# Patient Record
Sex: Female | Born: 1940 | Race: White | Hispanic: No | Smoking: Former smoker
Health system: Southern US, Community
[De-identification: ages and names within clinical notes are randomized; demographics above are authoritative.]

## PROBLEM LIST (undated history)

## (undated) DIAGNOSIS — M549 Dorsalgia, unspecified: Secondary | ICD-10-CM

## (undated) DIAGNOSIS — C3490 Malignant neoplasm of unspecified part of unspecified bronchus or lung: Secondary | ICD-10-CM

## (undated) DIAGNOSIS — M1990 Unspecified osteoarthritis, unspecified site: Secondary | ICD-10-CM

## (undated) DIAGNOSIS — M069 Rheumatoid arthritis, unspecified: Secondary | ICD-10-CM

## (undated) HISTORY — DX: Rheumatoid arthritis, unspecified: M06.9

## (undated) HISTORY — PX: LUNG REMOVAL, PARTIAL: SHX233

## (undated) HISTORY — PX: SPINAL FUSION: SHX223

## (undated) HISTORY — DX: Unspecified osteoarthritis, unspecified site: M19.90

## (undated) HISTORY — PX: REPLACEMENT TOTAL KNEE BILATERAL: SUR1225

## (undated) HISTORY — DX: Dorsalgia, unspecified: M54.9

## (undated) HISTORY — DX: Malignant neoplasm of unspecified part of unspecified bronchus or lung: C34.90

## (undated) HISTORY — PX: BREAST BIOPSY: SHX20

---

## 2019-02-14 ENCOUNTER — Other Ambulatory Visit: Payer: Self-pay | Admitting: Internal Medicine

## 2019-02-14 DIAGNOSIS — Z1231 Encounter for screening mammogram for malignant neoplasm of breast: Secondary | ICD-10-CM

## 2019-05-06 ENCOUNTER — Ambulatory Visit
Admission: RE | Admit: 2019-05-06 | Discharge: 2019-05-06 | Disposition: A | Discharge status: Home / Self Care | Payer: Medicare Other | Source: Ambulatory Visit | Attending: Internal Medicine | Admitting: Internal Medicine

## 2019-05-06 DIAGNOSIS — Z1231 Encounter for screening mammogram for malignant neoplasm of breast: Secondary | ICD-10-CM | POA: Insufficient documentation

## 2019-05-25 ENCOUNTER — Other Ambulatory Visit: Payer: Self-pay | Admitting: Internal Medicine

## 2019-05-25 DIAGNOSIS — R928 Other abnormal and inconclusive findings on diagnostic imaging of breast: Secondary | ICD-10-CM

## 2019-05-25 DIAGNOSIS — N6489 Other specified disorders of breast: Secondary | ICD-10-CM

## 2019-05-27 ENCOUNTER — Ambulatory Visit
Admission: RE | Admit: 2019-05-27 | Discharge: 2019-05-27 | Disposition: A | Discharge status: Home / Self Care | Payer: Medicare Other | Source: Ambulatory Visit | Attending: Internal Medicine | Admitting: Internal Medicine

## 2019-05-27 DIAGNOSIS — R928 Other abnormal and inconclusive findings on diagnostic imaging of breast: Secondary | ICD-10-CM | POA: Diagnosis present

## 2019-05-27 DIAGNOSIS — N6489 Other specified disorders of breast: Secondary | ICD-10-CM

## 2020-05-15 ENCOUNTER — Other Ambulatory Visit: Payer: Self-pay | Admitting: Obstetrics and Gynecology

## 2020-05-15 ENCOUNTER — Other Ambulatory Visit: Payer: Self-pay | Admitting: Internal Medicine

## 2020-05-15 DIAGNOSIS — Z1231 Encounter for screening mammogram for malignant neoplasm of breast: Secondary | ICD-10-CM

## 2020-05-31 ENCOUNTER — Other Ambulatory Visit: Payer: Self-pay

## 2020-05-31 ENCOUNTER — Ambulatory Visit
Admission: RE | Admit: 2020-05-31 | Discharge: 2020-05-31 | Disposition: A | Discharge status: Home / Self Care | Payer: Medicare Other | Source: Ambulatory Visit | Attending: Obstetrics and Gynecology | Admitting: Obstetrics and Gynecology

## 2020-05-31 DIAGNOSIS — Z1231 Encounter for screening mammogram for malignant neoplasm of breast: Secondary | ICD-10-CM | POA: Insufficient documentation

## 2020-09-11 ENCOUNTER — Other Ambulatory Visit (HOSPITAL_COMMUNITY): Payer: Self-pay | Admitting: Physical Medicine & Rehabilitation

## 2020-09-11 ENCOUNTER — Other Ambulatory Visit: Payer: Self-pay | Admitting: Physical Medicine & Rehabilitation

## 2020-09-11 DIAGNOSIS — G8929 Other chronic pain: Secondary | ICD-10-CM

## 2020-09-11 DIAGNOSIS — M5441 Lumbago with sciatica, right side: Secondary | ICD-10-CM

## 2020-09-20 ENCOUNTER — Ambulatory Visit: Payer: Medicare Other

## 2020-10-02 ENCOUNTER — Ambulatory Visit: Payer: Medicare Other | Admitting: Occupational Therapy

## 2020-10-04 ENCOUNTER — Ambulatory Visit
Admission: RE | Admit: 2020-10-04 | Discharge: 2020-10-04 | Disposition: A | Discharge status: Home / Self Care | Payer: Medicare Other | Source: Ambulatory Visit | Attending: Physical Medicine & Rehabilitation | Admitting: Physical Medicine & Rehabilitation

## 2020-10-04 ENCOUNTER — Other Ambulatory Visit: Payer: Self-pay

## 2020-10-04 ENCOUNTER — Ambulatory Visit: Payer: Medicare Other | Attending: Physical Medicine & Rehabilitation | Admitting: Occupational Therapy

## 2020-10-04 DIAGNOSIS — G8929 Other chronic pain: Secondary | ICD-10-CM

## 2020-10-04 DIAGNOSIS — M79641 Pain in right hand: Secondary | ICD-10-CM | POA: Diagnosis present

## 2020-10-04 DIAGNOSIS — M25641 Stiffness of right hand, not elsewhere classified: Secondary | ICD-10-CM | POA: Insufficient documentation

## 2020-10-04 DIAGNOSIS — M5441 Lumbago with sciatica, right side: Secondary | ICD-10-CM | POA: Insufficient documentation

## 2020-10-04 DIAGNOSIS — R600 Localized edema: Secondary | ICD-10-CM | POA: Diagnosis present

## 2020-10-04 NOTE — Therapy (Signed)
[place] [facility] [address] [place], [address] Phone: [phone]   Fax:  [phone]  Occupational Therapy Evaluation  Patient Details  Name: Crystal Malone MRN: [id number] Date of Birth: 1941/04/07 Referring Provider (OT): DR Adriana Mccallum Date: 10/04/2020   OT End of Session - 10/04/20 6962     Visit Number 1    Number of Visits 4    Date for OT Re-Evaluation 11/08/20    OT Start Time 0730    OT Stop Time 0815    OT Time Calculation (min) 45 min    Activity Tolerance Patient tolerated treatment well    Behavior During Therapy [facility] for tasks assessed/performed             No past medical history on file.  Past Surgical History:  Procedure Laterality Date   BREAST BIOPSY Left    benign     There were no vitals filed for this visit.   Subjective Assessment - 10/04/20 1634     Subjective  My hands had been bothering me for few years but gradually getting worse -the R hand worse and thumbs - index and ring finger middle knuckle - pain can increase to 8/10 - and hard time doing things like buttons, open jars or bags, had to modify a lot    Pertinent History 09/11/20 seen by DR Alba Destine -Assessment:  1. 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 Chronic right hip pain  3. Chronic right hand pain with swelling of the proximal interphalangeal joints  4. History of lung cancer 2012  5. 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 I did review the referring physician's note. She has been doing physical therapy since March 2022 and is continue with a home exercise program. She is also been taking Tylenol and tramadol. She cannot take NSAIDs due to GI bleeds. At this point she has failed conservative treatment as her pain is not under control. I did offer a right hip injection and she is interested in moving forward with this. Referral placed for right  hip injection. I also recommended MRI of the lumbar spine for further evaluation and she is interested moving forward with this as well. MRI lumbar spine ordered.    2. Follow-up 2 weeks postinjection. We will also review MRI lumbar spine at this time.    3. In terms of the right hand pain/swelling of the proximal interphalangeal joints, I did offer referral to orthopedic surgery but she would like to hold off on that. I did offer occupational therapy with inclusion of a paraffin bath and she is interested in moving forward with this. Referral placed.    Patient Stated Goals If I can get the pain better in my R hand and fingers so I can use them better- I do like to do things and activities    Currently in Pain? Yes    Pain Score 5    can increase to 8/10   Pain Location Hand    Pain Orientation Right    Pain Descriptors / Indicators Aching;Tender;Tightness    Pain Type Chronic pain    Pain Onset More than a month ago               [facility] OT Assessment - 10/04/20 0001       Assessment   Medical Diagnosis R hand  pain    Referring Provider (OT) DR Alba Destine    Onset Date/Surgical Date 09/11/20    Hand Dominance Right    Prior Therapy Years ago      Home  Environment   Lives With Alone      Prior Function   Vocation Retired    Leisure Retired Pharmacist, hospital, read, [employer], water workout, chair workout,      Strength   Right Hand Grip (lbs) 35    Right Hand Lateral Pinch 9 lbs    Right Hand 3 Point Pinch 7 lbs    Left Hand Grip (lbs) 36    Left Hand Lateral Pinch 10 lbs    Left Hand 3 Point Pinch 4 lbs      Right Hand AROM   R Thumb Opposition to Index --   Opposition WFL - some thumb pain   R Index  MCP 0-90 90 Degrees    R Index PIP 0-100 95 Degrees    R Long  MCP 0-90 90 Degrees    R Long PIP 0-100 95 Degrees    R Ring  MCP 0-90 90 Degrees    R Ring PIP 0-100 95 Degrees   -30   R Little  MCP 0-90 90 Degrees    R Little PIP 0-100 95 Degrees                      OT  Treatments/Exercises (OP) - 10/04/20 0001       RUE Paraffin   Number Minutes Paraffin 8 Minutes    RUE Paraffin Location Hand    Comments prior to review of HEP      LUE Paraffin   Number Minutes Paraffin 8 Minutes    LUE Paraffin Location Hand    Comments prior to review of HEP            pt to do moist heat am and pm Pain free tendon glides Opposition to all digits - pain free 10 reps  2 x day  Joint protection and AE trng done - and ed on - hand out provided and review         OT Education - 10/04/20 1643     Education Details findings of eval and HEP    Person(s) Educated Patient    Methods Explanation;Demonstration;Handout    Comprehension Verbal cues required;Returned demonstration;Verbalized understanding                 OT Long Term Goals - 10/04/20 1648       OT LONG TERM GOAL #1   Title Pt to be independent in HEP to decrease pain in R hand , increase 4th digit PIP extention and maintain AROM flexion and grip/prehension    Baseline no knowledge - pain 2-8/10 with flexion or use of digits- 4th PIP extention -30    Time 5    Period Weeks    Status New    Target Date 11/08/20      OT LONG TERM GOAL #2   Title Pt to be independent in HEP and verbalize 3 joint protection and AE to decrease pain in R hand    Baseline no knowledge - pain 2-8/10 with flexion or use    Time 3    Period Weeks    Status New    Target Date 10/25/20                   Plan - 10/04/20 1644  Clinical Impression Statement Pt refer to OT for R hand pain more than L -and pain mostly in thumb CMC, 2nd adn 4th PIP - enlarge PIP's and flexor contracture at 4th PIP - joints tender -and pt show AROM flexion WFL -and grip and prehension strength with range for her age - except L 3point pinch - pt pain can be 2-8/10- depending what she done - pt can benefit from OT services to decrease pain, increase or maintain ROM , strength- ed on joint protection and modification  of ADL's and IADL's to decrease pain    OT Occupational Profile and History Problem Focused Assessment - Including review of records relating to presenting problem    Occupational performance deficits (Please refer to evaluation for details): ADL's;IADL's;Play;Leisure;Social Participation    Body Structure / Function / Physical Skills ADL;Flexibility;FMC;Decreased knowledge of use of DME;Dexterity;Pain;Edema;IADL;ROM;UE functional use    Rehab Potential Fair    Clinical Decision Making Limited treatment options, no task modification necessary    Comorbidities Affecting Occupational Performance: May have comorbidities impacting occupational performance   chronic condition -arthritis   Modification or Assistance to Complete Evaluation  No modification of tasks or assist necessary to complete eval    OT Frequency 1x / week    OT Duration --   5 wks   OT Treatment/Interventions Self-care/ADL training;DME and/or AE instruction;Splinting;Paraffin    Consulted and Agree with Plan of Care Patient             Patient will benefit from skilled therapeutic intervention in order to improve the following deficits and impairments:   Body Structure / Function / Physical Skills: ADL, Flexibility, FMC, Decreased knowledge of use of DME, Dexterity, Pain, Edema, IADL, ROM, UE functional use       Visit Diagnosis: Pain in right hand - Plan: Ot plan of care cert/re-cert    Problem List There are no problems to display for this patient.   Crystal Malone  OTR/L, CLT  10/04/2020, 4:53 PM  [place] [facility] [address] [place], [address] Phone: [phone]   Fax:  [phone]  Name: Crystal Malone MRN: [id number] Date of Birth: 12/18/1940

## 2020-10-11 ENCOUNTER — Ambulatory Visit: Payer: Medicare Other | Admitting: Occupational Therapy

## 2020-10-11 ENCOUNTER — Other Ambulatory Visit: Payer: Self-pay

## 2020-10-11 DIAGNOSIS — M79641 Pain in right hand: Secondary | ICD-10-CM

## 2020-10-11 DIAGNOSIS — R600 Localized edema: Secondary | ICD-10-CM

## 2020-10-11 DIAGNOSIS — M25641 Stiffness of right hand, not elsewhere classified: Secondary | ICD-10-CM

## 2020-10-11 NOTE — Therapy (Signed)
[place] [facility] [address] [place], [address] Phone: [phone]   Fax:  [phone]  Occupational Therapy Treatment  Patient Details  Name: Crystal Malone MRN: [id number] Date of Birth: 07/06/1940 Referring Provider (OT): DR Adriana Mccallum Date: 10/11/2020   OT End of Session - 10/11/20 1432     Visit Number 2    Number of Visits 4    Date for OT Re-Evaluation 11/08/20    OT Start Time 0817    OT Stop Time 0900    OT Time Calculation (min) 43 min    Activity Tolerance Patient tolerated treatment well    Behavior During Therapy [facility] for tasks assessed/performed             No past medical history on file.  Past Surgical History:  Procedure Laterality Date   BREAST BIOPSY Left    benign     There were no vitals filed for this visit.   Subjective Assessment - 10/11/20 0832     Subjective  I fell since I have seen you - but did not fell on my hand - but more my ankle - no fracture - but my family DR think my finger is gout - don't know if I need to see ortho or Rheumathologist- appt with orhor Tues    Pertinent History 09/11/20 seen by DR Alba Destine -Assessment:  1. 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 Chronic right hip pain  3. Chronic right hand pain with swelling of the proximal interphalangeal joints  4. History of lung cancer 2012  5. 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 I did review the referring physician's note. She has been doing physical therapy since March 2022 and is continue with a home exercise program. She is also been taking Tylenol and tramadol. She cannot take NSAIDs due to GI bleeds. At this point she has failed conservative treatment as her pain is not under control. I did offer a right hip injection and she is interested in moving forward with this. Referral placed for right hip injection. I also recommended MRI  of the lumbar spine for further evaluation and she is interested moving forward with this as well. MRI lumbar spine ordered.    2. Follow-up 2 weeks postinjection. We will also review MRI lumbar spine at this time.    3. In terms of the right hand pain/swelling of the proximal interphalangeal joints, I did offer referral to orthopedic surgery but she would like to hold off on that. I did offer occupational therapy with inclusion of a paraffin bath and she is interested in moving forward with this. Referral placed.    Patient Stated Goals If I can get the pain better in my R hand and fingers so I can use them better- I do like to do things and activities    Currently in Pain? Yes    Pain Score 6    Ring finger - hand 3/10   Pain Location Finger (Comment which one)    Pain Orientation Right    Pain Descriptors / Indicators Aching    Pain Type Chronic pain    Pain Onset More than a month ago    Pain Frequency Constant               Pt with increase edema and pain in R 4th PIP -  decrease AROM since last week  Report PCP ? Gout - put on medication since Monday  Did had fall but did not hit her hand - more her ankle  Pain 6/10            OT Treatments/Exercises (OP) - 10/11/20 0001       RUE Contrast Bath   Time 8 minutes    Comments decrease pain      LUE Contrast Bath   Time 8 minutes    Comments decrease pain , increase ROM            pain decrease after contrast -and review HEP with pt   Pt to do contrast if edema, inflammation and pain  Fit with compression sleeve to wear on 4th Digit - most all the time - if tolerating  Pain free tendon glides gentle  Opposition to all digits - pain free but not to 4th this date - hold off  10 reps 2 x day Joint protection and AE trng done last time and to cont with -  hand out provided and review last time  Pt questions about RA, OA and gout-            OT Education - 10/11/20 0837     Education Details review HEP     Person(s) Educated Patient    Methods Explanation;Demonstration;Handout    Comprehension Verbal cues required;Returned demonstration;Verbalized understanding                 OT Long Term Goals - 10/04/20 1648       OT LONG TERM GOAL #1   Title Pt to be independent in HEP to decrease pain in R hand , increase 4th digit PIP extention and maintain AROM flexion and grip/prehension    Baseline no knowledge - pain 2-8/10 with flexion or use of digits- 4th PIP extention -30    Time 5    Period Weeks    Status New    Target Date 11/08/20      OT LONG TERM GOAL #2   Title Pt to be independent in HEP and verbalize 3 joint protection and AE to decrease pain in R hand    Baseline no knowledge - pain 2-8/10 with flexion or use    Time 3    Period Weeks    Status New    Target Date 10/25/20                   Plan - 10/11/20 1433     Clinical Impression Statement Pt refer to OT for R hand pain more than L -and pain mostly in thumb CMC, 2nd adn 4th PIP - enlarge PIP's and flexor contracture at 4th PIP - joints tender - at eval last week pt show AROM flexion WFL -and grip and prehension strength with  in range for her age - except L 3point pinch - pt pain can be 2-8/10- depending what she done - Since last time had increase pain and edema in L 4th PIP - pt PCP ? gout -and put pt on medication for gout -and refer to ortho - pt pain decrease with contrast this date - to wear compression and not push HEP - keep pain free - pt can benefit from OT services to decrease pain, increase or maintain AROM , strength- ed on joint protection and modification of ADL's and IADL's to decrease pain    OT Occupational Profile and History Problem Focused Assessment -  Including review of records relating to presenting problem    Occupational performance deficits (Please refer to evaluation for details): ADL's;IADL's;Play;Leisure;Social Participation    Body Structure / Function / Physical Skills  ADL;Flexibility;FMC;Decreased knowledge of use of DME;Dexterity;Pain;Edema;IADL;ROM;UE functional use    Rehab Potential Fair    Clinical Decision Making Limited treatment options, no task modification necessary    Comorbidities Affecting Occupational Performance: May have comorbidities impacting occupational performance    Modification or Assistance to Complete Evaluation  No modification of tasks or assist necessary to complete eval    OT Frequency 1x / week    OT Duration 4 weeks    OT Treatment/Interventions Self-care/ADL training;DME and/or AE instruction;Splinting;Paraffin    Consulted and Agree with Plan of Care Patient             Patient will benefit from skilled therapeutic intervention in order to improve the following deficits and impairments:   Body Structure / Function / Physical Skills: ADL, Flexibility, FMC, Decreased knowledge of use of DME, Dexterity, Pain, Edema, IADL, ROM, UE functional use       Visit Diagnosis: Pain in right hand  Localized edema  Stiffness of right hand, not elsewhere classified    Problem List There are no problems to display for this patient.   Rosalyn Gess OTR/L,CLT 10/11/2020, 2:37 PM  [place] [facility] [address], [place], [address] Phone: [phone]   Fax:  [phone]  Name: Kenyotta Dorfman MRN: [id number] Date of Birth: 10-03-1940

## 2020-10-18 ENCOUNTER — Ambulatory Visit: Payer: Medicare Other | Admitting: Occupational Therapy

## 2020-10-18 DIAGNOSIS — M79641 Pain in right hand: Secondary | ICD-10-CM | POA: Diagnosis not present

## 2020-10-18 DIAGNOSIS — M25641 Stiffness of right hand, not elsewhere classified: Secondary | ICD-10-CM

## 2020-10-18 DIAGNOSIS — R600 Localized edema: Secondary | ICD-10-CM

## 2020-10-18 NOTE — Therapy (Signed)
[place] [facility] [address], [place], [address] Phone: [phone]   Fax:  [phone]  Occupational Therapy Treatment  Patient Details  Name: Crystal Malone MRN: [id number] Date of Birth: 07/02/1940 Referring Provider (OT): DR Adriana Mccallum Date: 10/18/2020   OT End of Session - 10/18/20 1110     Visit Number 3    Number of Visits 3    Date for OT Re-Evaluation 10/18/20    OT Start Time 0815    OT Stop Time 0845    OT Time Calculation (min) 30 min    Activity Tolerance Patient tolerated treatment well    Behavior During Therapy [facility] for tasks assessed/performed             No past medical history on file.  Past Surgical History:  Procedure Laterality Date   BREAST BIOPSY Left    benign     There were no vitals filed for this visit.   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 Chronic right hip pain  3. Chronic right hand pain with swelling of the proximal interphalangeal joints  4. History of lung cancer 2012  5. 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 I did review the referring physician's note. She has been doing physical therapy since March 2022 and is continue with a home exercise program. She is also been taking Tylenol and tramadol. She cannot take NSAIDs due to GI bleeds. At this point she has failed conservative treatment as her pain is not under control. I did offer a right hip injection and she is interested in moving forward with this. Referral placed for right hip injection. I also recommended MRI of the lumbar spine for further evaluation and she  is interested moving forward with this as well. MRI lumbar spine ordered.    2. Follow-up 2 weeks postinjection. We will also review MRI lumbar spine at this time.    3. In terms of the right hand pain/swelling of the proximal interphalangeal joints, I did offer referral to orthopedic surgery but she would like to hold off on that. I did offer occupational therapy with inclusion of a paraffin bath and she is interested in moving forward with this. Referral placed.    Patient Stated Goals If I can get the pain better in my R hand and fingers so I can use them better- I do like to do things and activities    Currently in Pain? Yes    Pain Location Finger (Comment which one)    Pain Orientation Right    Pain Descriptors / Indicators Aching    Pain Type Chronic pain    Pain Onset More than a month ago    Aggravating Factors  flexion of ring finger                [facility] OT Assessment - 10/18/20 0001       Strength   Right Hand Grip (lbs) 35    Right Hand Lateral Pinch 9 lbs    Right Hand  3 Point Pinch 4 lbs    Left Hand Grip (lbs) 36    Left Hand Lateral Pinch 10 lbs    Left Hand 3 Point Pinch 6 lbs      Right Hand AROM   R Thumb Opposition to Index --   pain to 4th - use 2 cm foam block   R Ring  MCP 0-90 80 Degrees    R Ring PIP 0-100 80 Degrees   0 extention this date- but still pain with flexion             Pt seen ortho on 10/15/20 -and results of the x-rays of the right hand taken by Dr. 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 The PIP joint of the ring finger shows complete destruction with significant osteophyte formation been noted.   This date pt cont to have pain with flexion of R 4th digit PIP -and thumb CMC  Pt fitted with CMC neoprene splint to use with activities that bothers her thumb and writing - pt  felt better in it       pain decrease after contrast in past - Paraffin will increase edema in R 4th PIP -and review HEP with pt    Pt to do contrast if edema, inflammation and pain Cont compression sleeve to wear on 4th Digit - most all the time - if tolerating   Pain free tendon glides gentle  Opposition to all digits - pain free but not to 4th this date - hold off  10 reps 2 x day Joint protection and AE trng to cont with  -  hand out provided and review last time Pt to discuss with MD options for pain control for pain in R 4th PIP                OT Education - 10/18/20 1109     Education Details review HEP    Person(s) Educated Patient    Methods Explanation;Demonstration;Handout    Comprehension Verbal cues required;Returned demonstration;Verbalized understanding                 OT Long Term Goals - 10/18/20 1115       OT LONG TERM GOAL #1   Title Pt to be independent in HEP to decrease pain in R hand , increase 4th digit PIP extention and maintain AROM flexion and grip/prehension    Baseline has knowledge now but  - pain 2-8/10 with flexion of R 4th PIP extention - extention improve to WNL      OT LONG TERM GOAL #2   Title Pt to be independent in HEP and verbalize 3 joint protection and AE to decrease pain in R hand    Baseline has knowledge - pain 2-8/10 with flexion of 4th PIP of R hand    Status Partially Met                   Plan - 10/18/20 1111     Clinical Impression Statement Pt refer to OT for R hand pain more than L -and pain mostly in thumb CMC, 2nd and 4th PIP - enlarge PIP's and flexor contracture at 4th PIP - joints tender - at eval 2 weeks ago pt show AROM flexion WFL -and grip and prehension strength with  in range  for her age - except L 3point pinch - pt pain can be 2-8/10- depending what she done - Since eval had increase pain and edema in L 4th PIP - pt PCP ? gout -and put pt on medication for gout that do appear helped  for the pain - more than 50% better -and refer to ortho - pt pain do decrease with contrast - to wear compression and not push HEP - keep pain free AROM - cont joint protection and modifications and get with MD about other options for pain control for OA in 4th PIP - did fit pt with CMC neoprene splint on R thumb to decrease pain with use and writing - pt to cont with [facility] and follow up with Dr Alba Destine.    OT Occupational Profile and History Problem Focused Assessment - Including review of records relating to presenting problem    Occupational performance deficits (Please refer to evaluation for details): ADL's;IADL's;Play;Leisure;Social Participation    Body Structure / Function / Physical Skills ADL;Flexibility;FMC;Decreased knowledge of use of DME;Dexterity;Pain;Edema;IADL;ROM;UE functional use    Rehab Potential Fair    Clinical Decision Making Limited treatment options, no task modification necessary    Comorbidities Affecting Occupational Performance: May have comorbidities impacting occupational performance    Modification or Assistance to Complete Evaluation  No modification of tasks or assist necessary to complete eval    OT Treatment/Interventions Self-care/ADL training;DME and/or AE instruction;Splinting;Paraffin    Consulted and Agree with Plan of Care Patient             Patient will benefit from skilled therapeutic intervention in order to improve the following deficits and impairments:   Body Structure / Function / Physical Skills: ADL, Flexibility, FMC, Decreased knowledge of use of DME, Dexterity, Pain, Edema, IADL, ROM, UE functional use       Visit Diagnosis: Pain in right hand  Localized edema  Stiffness of right hand, not elsewhere classified    Problem List There are no problems to display for this patient.   Rosalyn Gess OTR/L,CLT 10/18/2020, 11:18 AM  [place] [facility] [address] [place], [address] Phone: [phone]   Fax:  [phone]  Name: Vittoria Noreen MRN: [id number] Date of Birth: March 30, 1941

## 2020-10-25 ENCOUNTER — Other Ambulatory Visit: Payer: Self-pay

## 2020-10-25 ENCOUNTER — Other Ambulatory Visit: Payer: Self-pay | Admitting: Infectious Diseases

## 2020-10-25 ENCOUNTER — Ambulatory Visit
Admission: RE | Admit: 2020-10-25 | Discharge: 2020-10-25 | Disposition: A | Discharge status: Home / Self Care | Payer: Medicare Other | Source: Ambulatory Visit | Attending: Infectious Diseases | Admitting: Infectious Diseases

## 2020-10-25 DIAGNOSIS — M25572 Pain in left ankle and joints of left foot: Secondary | ICD-10-CM | POA: Diagnosis present

## 2020-10-25 DIAGNOSIS — M79662 Pain in left lower leg: Secondary | ICD-10-CM | POA: Diagnosis present

## 2020-11-02 ENCOUNTER — Other Ambulatory Visit: Payer: Self-pay

## 2020-11-02 ENCOUNTER — Ambulatory Visit (INDEPENDENT_AMBULATORY_CARE_PROVIDER_SITE_OTHER): Payer: Medicare Other | Admitting: Podiatry

## 2020-11-02 ENCOUNTER — Ambulatory Visit (INDEPENDENT_AMBULATORY_CARE_PROVIDER_SITE_OTHER): Payer: Medicare Other

## 2020-11-02 DIAGNOSIS — M722 Plantar fascial fibromatosis: Secondary | ICD-10-CM

## 2020-11-02 DIAGNOSIS — M659 Synovitis and tenosynovitis, unspecified: Secondary | ICD-10-CM

## 2020-11-02 DIAGNOSIS — M7672 Peroneal tendinitis, left leg: Secondary | ICD-10-CM | POA: Diagnosis not present

## 2020-11-02 MED ORDER — BETAMETHASONE SOD PHOS & ACET 6 (3-3) MG/ML IJ SUSP
3.0000 mg | Freq: Once | INTRAMUSCULAR | Status: AC
Start: 2020-11-02 — End: ?

## 2020-11-02 NOTE — Progress Notes (Signed)
   Subjective: 80 y.o. female presenting as a new patient for evaluation of left foot pain.  Patient states that she sustained a trip and fall injury about 1 month ago and injured her left foot.  He continued to be painful and swollen so she went to the urgent care about 1 week ago and she was diagnosed with a blood clot/DVT.  She is currently on anticoagulant treatment for the past week.  She presents today for evaluation of her continued foot and ankle pain.   No past medical history on file.   Objective: Physical Exam General: The patient is alert and oriented x3 in no acute distress.  Dermatology: Skin is warm, dry and supple bilateral lower extremities. Negative for open lesions or macerations bilateral.   Vascular: Dorsalis Pedis and Posterior Tibial pulses palpable bilateral.  Capillary fill time is immediate to all digits.  Neurological: Epicritic and protective threshold intact bilateral.   Musculoskeletal: Tenderness to palpation to the plantar aspect of the left heel along the plantar fascia, posterior aspect of the ankle along the peroneal tendon sheath, and is well as the anterior lateral aspect of the left ankle. All other joints range of motion within normal limits bilateral. Strength 5/5 in all groups bilateral.   Radiographic exam: Normal osseous mineralization.  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 No fracture/dislocation/boney destruction. No other soft tissue abnormalities or radiopaque foreign bodies.   Assessment: 1. Plantar fasciitis left foot 2.  Peroneal tendinitis left 3.  Ankle capsulitis left  Plan of Care:  1. Patient evaluated. Xrays reviewed.   2. Injection of 0.5cc Celestone soluspan injected into the left plantar fascia.  3.  Compression ankle sleeve dispensed.  Wear daily 4.  Cam boot dispensed.  Weightbearing as tolerated daily 5.  No prescriptions.  Patient on anticoagulant and is also allergic to NSAIDs 6.  Return to  clinic 3 weeks   Edrick Kins, DPM [facility]  Dr. Edrick Kins, DPM    [address], [place] [address]                Office [phone]  Fax [phone]

## 2020-11-20 ENCOUNTER — Ambulatory Visit: Payer: Medicare Other | Attending: Physical Medicine & Rehabilitation | Admitting: Occupational Therapy

## 2020-11-20 DIAGNOSIS — M25641 Stiffness of right hand, not elsewhere classified: Secondary | ICD-10-CM | POA: Insufficient documentation

## 2020-11-20 DIAGNOSIS — M79641 Pain in right hand: Secondary | ICD-10-CM | POA: Insufficient documentation

## 2020-11-20 DIAGNOSIS — R600 Localized edema: Secondary | ICD-10-CM | POA: Insufficient documentation

## 2020-11-20 NOTE — Therapy (Signed)
[facility] [address], [place], [address] Phone: [phone]   Fax:  [phone]  Occupational Therapy Treatment  Patient Details  Name: Crystal Malone MRN: [id number] Date of Birth: 06/30/1940 Referring Provider (OT): DR Adriana Mccallum Date: 11/20/2020   OT End of Session - 11/20/20 1221     Visit Number 4    Number of Visits 6    Date for OT Re-Evaluation 01/01/21    OT Start Time 1120    OT Stop Time 1155    OT Time Calculation (min) 35 min    Activity Tolerance Patient tolerated treatment well    Behavior During Therapy [facility] for tasks assessed/performed             No past medical history on file.  Past Surgical History:  Procedure Laterality Date   BREAST BIOPSY Left    benign     There were no vitals filed for this visit.   Subjective Assessment - 11/20/20 1219     Subjective  I had DVT in my foot they found - so my thumb is doing okay but my hand and wrist on the pinikie and wrist side is reallly bothering me with gripping like driving , making bed and workout in the pool    Pertinent History 09/11/20 seen by DR Alba Destine -Assessment:  1. 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 Chronic right hip pain  3. Chronic right hand pain with swelling of the proximal interphalangeal joints  4. History of lung cancer 2012  5. 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 I did review the referring physician's note. She has been doing physical therapy since March 2022 and is continue with a home exercise program. She is also been taking Tylenol and tramadol. She cannot take NSAIDs due to GI bleeds. At this point she has failed conservative treatment as her pain is not under control. I did offer a right hip injection and she is interested in moving forward with this. Referral placed for right hip injection. I also recommended MRI of the lumbar  spine for further evaluation and she is interested moving forward with this as well. MRI lumbar spine ordered.    2. Follow-up 2 weeks postinjection. We will also review MRI lumbar spine at this time.    3. In terms of the right hand pain/swelling of the proximal interphalangeal joints, I did offer referral to orthopedic surgery but she would like to hold off on that. I did offer occupational therapy with inclusion of a paraffin bath and she is interested in moving forward with this. Referral placed.    Patient Stated Goals If I can get the pain better in my R hand and fingers so I can use them better- I do like to do things and activities    Currently in Pain? Yes    Pain Score 6     Pain Location Hand   over ulnar wrist- pisiforme   Pain Orientation Right    Pain Descriptors / Indicators Tender;Aching    Pain Type Chronic pain    Pain Onset More than a month ago    Pain Frequency Constant             Pt arrive this date with reports of increase pain over 4th and 5th MC and ulnar side of hand - over pisiforme  With gripping activities - driving , water work out , making bed    Pt seen ortho on 10/15/20 -and results of the x-rays of the right hand taken by Dr. Ola Spurr which shows severe destruction of the [facility] joint of thumb with subluxation of the MP joint of the thumb. 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 The PIP joint of the ring finger shows complete destruction with significant osteophyte formation been noted.    This date pt cont to have pain with flexion of R 4th digit PIP -and thumb CMC Pt fitted with CMC neoprene splint  lat time to use with activities that bothers her thumb and writing - pt felt better in it            Pt to do contrast if edema, inflammation and pain Cont compression sleeve to wear on 4th Digit - most all the time - if tolerating And then this date pt tender over 4th and 5th A1 pulley - but no triggering   And tenderness over pisiforme - and wrist flexion- or weight bearing    Pt fitted with Benik neoprene splint to use for gripping , weight bearing  And then [facility] block splint for 5th to decrease hyper flexion at 4th and 5th MC - wear with gripping activities And silicon sleeve on 4th digit  With water workout  - pt to hold off on using exercises equipment and noodle - only hands  Joint protection and AE trng to cont with  -  hand out provided and review in past                         OT Education - 11/20/20 1221     Education Details review HEP and splint changes    Person(s) Educated Patient    Methods Explanation;Demonstration;Handout    Comprehension Verbal cues required;Returned demonstration;Verbalized understanding                 OT Long Term Goals - 11/20/20 1229       OT LONG TERM GOAL #1   Title Pt to be independent in HEP to decrease pain in R hand , increase 4th digit PIP extention and maintain AROM flexion and grip/prehension    Baseline has knowledge now but  - pain 2-8/10 with flexion of R 4th PIP extention - extention improve to WNL    Status Achieved      OT LONG TERM GOAL #2   Title Pt to be independent in HEP and verbalize 3 joint protection and AE to decrease pain in R hand    Baseline has knowledge - pain 2-8/10 with flexion of 4th PIP of R hand - still neede reminder and ed on modifications to activiites to decreaes pain -    Time 6    Period Weeks    Status On-going    Target Date 01/01/21                   Plan - 11/20/20 1222     Clinical Impression Statement Pt refer to OT for R hand pain more than L intitially - pain mostly in thumb CMC, 2nd and 4th PIP - enlarge PIP's  - joints tender - pt was diagosis with severe OT in thumb and 4th PIP - had also gout per her  PCP - was on medication for gout that do appear helped for  the pain . Pt report pain in thumb is better with CMC neoprene and gout in 4th better - but  increase discomfort and pain over [facility] 's of 5th and 4th - tenderness over A1pulley -and pisiforme with gripping like making bed, driving , water work out - review again wtih pt joint protectin and modifications - as well as changes to not use work out noodle and weight in pool - only hands. Change her splints to Benik neoprene for ulnar wrist pain , and MC block splint for 5th - to decrease compensation and hyper flexion at 4th and 5th MC's - to wear with power grip activities. CMC neoprene splints with dexterity or FM activities- pt going t obe out of town - to call if need to follow up  - pt pain do decrease with contrast - to wear compression and not forceHEP - keep pain free AROM.    OT Occupational Profile and History Problem Focused Assessment - Including review of records relating to presenting problem    Occupational performance deficits (Please refer to evaluation for details): ADL's;IADL's;Play;Leisure;Social Participation    Body Structure / Function / Physical Skills ADL;Flexibility;FMC;Decreased knowledge of use of DME;Dexterity;Pain;Edema;IADL;ROM;UE functional use    Rehab Potential Fair    Clinical Decision Making Limited treatment options, no task modification necessary    Comorbidities Affecting Occupational Performance: May have comorbidities impacting occupational performance    Modification or Assistance to Complete Evaluation  No modification of tasks or assist necessary to complete eval    OT Frequency Biweekly    OT Duration 6 weeks    OT Treatment/Interventions Self-care/ADL training;DME and/or AE instruction;Splinting;Paraffin    Consulted and Agree with Plan of Care Patient             Patient will benefit from skilled therapeutic intervention in order to improve the following deficits and impairments:   Body Structure / Function / Physical Skills: ADL, Flexibility, FMC, Decreased knowledge of use of DME, Dexterity, Pain, Edema, IADL, ROM, UE functional use        Visit Diagnosis: Localized edema - Plan: Ot plan of care cert/re-cert  Pain in right hand - Plan: Ot plan of care cert/re-cert  Stiffness of right hand, not elsewhere classified - Plan: Ot plan of care cert/re-cert    Problem List There are no problems to display for this patient.   Rosalyn Gess OTR?L,CLT 11/20/2020, 5:16 PM  [facility] [address], [place], [address] Phone: [phone]   Fax:  [phone]  Name: Crystal Malone MRN: [id number] Date of Birth: 11-14-1940

## 2020-11-27 ENCOUNTER — Ambulatory Visit (INDEPENDENT_AMBULATORY_CARE_PROVIDER_SITE_OTHER): Payer: Medicare Other | Admitting: Podiatry

## 2020-11-27 ENCOUNTER — Other Ambulatory Visit: Payer: Self-pay

## 2020-11-27 DIAGNOSIS — M76822 Posterior tibial tendinitis, left leg: Secondary | ICD-10-CM | POA: Diagnosis not present

## 2020-11-27 DIAGNOSIS — M722 Plantar fascial fibromatosis: Secondary | ICD-10-CM

## 2020-11-27 DIAGNOSIS — M659 Synovitis and tenosynovitis, unspecified: Secondary | ICD-10-CM | POA: Diagnosis not present

## 2020-11-27 DIAGNOSIS — M7672 Peroneal tendinitis, left leg: Secondary | ICD-10-CM | POA: Diagnosis not present

## 2020-11-27 MED ORDER — BETAMETHASONE SOD PHOS & ACET 6 (3-3) MG/ML IJ SUSP: 3.0000 mg | Freq: Once | INTRAMUSCULAR | Status: AC

## 2020-11-27 NOTE — Progress Notes (Signed)
   Subjective: 80 y.o. female presenting as a new patient for follow-up evaluation of left foot pain.  Patient states that overall she is feeling much better to the left lower extremity.  She says that the cam boot has helped significantly.  She is having some sharp shooting pains along the medial ankle now.  She presents for further treatment and evaluation   No past medical history on file.   Objective: Physical Exam General: The patient is alert and oriented x3 in no acute distress.  Dermatology: Skin is warm, dry and supple bilateral lower extremities. Negative for open lesions or macerations bilateral.   Vascular: Dorsalis Pedis and Posterior Tibial pulses palpable bilateral.  Capillary fill time is immediate to all digits.  Neurological: Epicritic and protective threshold intact bilateral.   Musculoskeletal: Tenderness to palpation to the plantar aspect of the left heel along the plantar fascia, posterior aspect of the ankle along the peroneal tendon sheath, and is well as the anterior lateral aspect of the left ankle and today along the posterior tibial tendon sheath. All other joints range of motion within normal limits bilateral. Strength 5/5 in all groups bilateral.   Radiographic exam taken last visit: Normal osseous mineralization.  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 No fracture/dislocation/boney destruction. No other soft tissue abnormalities or radiopaque foreign bodies.   Assessment: 1. Plantar fasciitis left foot 2.  Peroneal tendinitis left 3.  Ankle capsulitis left 4.  Posterior tibial tendinitis left  Plan of Care:  1. Patient evaluated. Xrays reviewed.   2. Injection of 0.5cc Celestone soluspan injected along the posterior tibial tendon sheath left  3.  Continue compression ankle sleeve daily  4.  Patient may discontinue cam boot.  Recommend good supportive sneakers and shoes.  Patient currently wearing Merrells 5.  No  prescriptions.  Patient on anticoagulant and is also allergic to NSAIDs 6.  Return to clinic as needed   Edrick Kins, DPM [facility]  Dr. Edrick Kins, DPM    [address], [place] [address]                Office [phone]  Fax [phone]

## 2021-01-29 ENCOUNTER — Ambulatory Visit: Payer: Medicare Other | Admitting: Podiatry

## 2021-02-11 ENCOUNTER — Emergency Department: Payer: Medicare Other

## 2021-02-11 ENCOUNTER — Other Ambulatory Visit: Payer: Self-pay

## 2021-02-11 ENCOUNTER — Emergency Department
Admission: EM | Admit: 2021-02-11 | Discharge: 2021-02-11 | Disposition: A | Discharge status: Home / Self Care | Payer: Medicare Other | Attending: Emergency Medicine | Admitting: Emergency Medicine

## 2021-02-11 DIAGNOSIS — R2241 Localized swelling, mass and lump, right lower limb: Secondary | ICD-10-CM | POA: Insufficient documentation

## 2021-02-11 DIAGNOSIS — Z86718 Personal history of other venous thrombosis and embolism: Secondary | ICD-10-CM | POA: Insufficient documentation

## 2021-02-11 DIAGNOSIS — Z7901 Long term (current) use of anticoagulants: Secondary | ICD-10-CM | POA: Diagnosis not present

## 2021-02-11 DIAGNOSIS — Z85118 Personal history of other malignant neoplasm of bronchus and lung: Secondary | ICD-10-CM | POA: Insufficient documentation

## 2021-02-11 DIAGNOSIS — R2242 Localized swelling, mass and lump, left lower limb: Secondary | ICD-10-CM | POA: Insufficient documentation

## 2021-02-11 DIAGNOSIS — I872 Venous insufficiency (chronic) (peripheral): Secondary | ICD-10-CM | POA: Diagnosis not present

## 2021-02-11 DIAGNOSIS — R600 Localized edema: Secondary | ICD-10-CM | POA: Insufficient documentation

## 2021-02-11 DIAGNOSIS — J45909 Unspecified asthma, uncomplicated: Secondary | ICD-10-CM | POA: Insufficient documentation

## 2021-02-11 DIAGNOSIS — Z853 Personal history of malignant neoplasm of breast: Secondary | ICD-10-CM | POA: Insufficient documentation

## 2021-02-11 DIAGNOSIS — R609 Edema, unspecified: Secondary | ICD-10-CM

## 2021-02-11 LAB — CBC WITH DIFFERENTIAL/PLATELET
Abs Immature Granulocytes: 0.04 10*3/uL (ref 0.00–0.07)
Basophils Absolute: 0 10*3/uL (ref 0.0–0.1)
Basophils Relative: 0 %
Eosinophils Absolute: 0.1 10*3/uL (ref 0.0–0.5)
Eosinophils Relative: 1 %
HCT: 43.9 % (ref 36.0–46.0)
Hemoglobin: 14.2 g/dL (ref 12.0–15.0)
Immature Granulocytes: 0 %
Lymphocytes Relative: 28 %
Lymphs Abs: 2.9 10*3/uL (ref 0.7–4.0)
MCH: 28.8 pg (ref 26.0–34.0)
MCHC: 32.3 g/dL (ref 30.0–36.0)
MCV: 89 fL (ref 80.0–100.0)
Monocytes Absolute: 1.2 10*3/uL — ABNORMAL HIGH (ref 0.1–1.0)
Monocytes Relative: 11 %
Neutro Abs: 6.2 10*3/uL (ref 1.7–7.7)
Neutrophils Relative %: 60 %
Platelets: 288 10*3/uL (ref 150–400)
RBC: 4.93 MIL/uL (ref 3.87–5.11)
RDW: 13.3 % (ref 11.5–15.5)
WBC: 10.5 10*3/uL (ref 4.0–10.5)
nRBC: 0 % (ref 0.0–0.2)

## 2021-02-11 LAB — COMPREHENSIVE METABOLIC PANEL
ALT: 14 U/L (ref 0–44)
AST: 23 U/L (ref 15–41)
Albumin: 4 g/dL (ref 3.5–5.0)
Alkaline Phosphatase: 69 U/L (ref 38–126)
Anion gap: 8 (ref 5–15)
BUN: 13 mg/dL (ref 8–23)
CO2: 25 mmol/L (ref 22–32)
Calcium: 9.1 mg/dL (ref 8.9–10.3)
Chloride: 104 mmol/L (ref 98–111)
Creatinine, Ser: 0.64 mg/dL (ref 0.44–1.00)
GFR, Estimated: >60 mL/min (ref >60)
Glucose, Bld: 91 mg/dL (ref 70–99)
Potassium: 3.9 mmol/L (ref 3.5–5.1)
Sodium: 137 mmol/L (ref 135–145)
Total Bilirubin: 1.1 mg/dL (ref 0.3–1.2)
Total Protein: 7.7 g/dL (ref 6.5–8.1)

## 2021-02-11 LAB — BRAIN NATRIURETIC PEPTIDE: B Natriuretic Peptide: 28.9 pg/mL (ref 0.0–100.0)

## 2021-02-11 NOTE — ED Provider Notes (Signed)
[facility] Emergency Department Provider Note   ____________________________________________   Event Date/Time   First MD Initiated Contact with Patient 02/11/21 1728     (approximate)  I have reviewed the triage vital signs and the nursing notes.   HISTORY  Chief Complaint Leg Swelling    HPI Crystal Malone is a 80 y.o. female with past medical history of lung cancer, asthma, GERD, osteoarthritis, and DVT on Xarelto who presents to the ED complaining of leg swelling.  Patient reports approximately 5 days of gradually worsening swelling affecting both of her legs, left greater than right.  She states "it is not painful but it does hurt" in the lateral portion of her left calf, denies any pain in her right lower extremity.  She denies any falls or other trauma to her lower extremities.  She has not noticed any rash or other skin changes, denies any chest pain or shortness of breath.  She has been compliant with Xarelto and denies missing any doses.  She has been dealing with a cough recently and was prescribed amoxicillin for bronchitis.        No past medical history on file.  There are no problems to display for this patient.   Past Surgical History:  Procedure Laterality Date   BREAST BIOPSY Left    benign     Prior to Admission medications   Medication Sig Start Date End Date Taking? Authorizing Provider  amoxicillin (AMOXIL) 500 MG capsule Take by mouth. 11/01/20   [provider]  benzonatate (TESSALON) 200 MG capsule  09/21/20   [provider]  buPROPion (WELLBUTRIN XL) 300 MG 24 hr tablet  09/01/20   [provider]  ciprofloxacin (CIPRO) 500 MG tablet  07/28/20   [provider]  colchicine 0.6 MG tablet Take 2 tablets (1.2mg ) by mouth at first sign of gout flare followed by 1 tablet (0.6mg ) after 1 hour. (Max 1.8mg  within 1 hour) then daily until swelling gone. 10/08/20   [provider]     Allergies Latex and Nsaids  Family History  Problem Relation Age of Onset   Breast cancer Neg Hx     Social History    Review of Systems  Constitutional: No fever/chills Eyes: No visual changes. ENT: No sore throat. Cardiovascular: Denies chest pain. Respiratory: Denies shortness of breath.  Positive for cough. Gastrointestinal: No abdominal pain.  No nausea, no vomiting.  No diarrhea.  No constipation. Genitourinary: Negative for dysuria. Musculoskeletal: Negative for back pain.  Positive for leg swelling and pain. Skin: Negative for rash. Neurological: Negative for headaches, focal weakness or numbness.  ____________________________________________   PHYSICAL EXAM:  VITAL SIGNS: ED Triage Vitals  Enc Vitals Group     BP 02/11/21 1540 (!) 171/66     Pulse Rate 02/11/21 1540 68     Resp 02/11/21 1540 18     Temp 02/11/21 1540 97.7 F (36.5 C)     Temp Source 02/11/21 1540 Oral     SpO2 02/11/21 1540 96 %     Weight 02/11/21 1541 225 lb (102.1 kg)     Height 02/11/21 1541 5\' 6"  (1.676 m)     Head Circumference --      Peak Flow --      Pain Score 02/11/21 1541 4     Pain Loc --      Pain Edu? --      Excl. in [place]? --     Constitutional: Alert and oriented.  Eyes: Conjunctivae are normal. Head: Atraumatic. Nose: No congestion/rhinnorhea. Mouth/Throat: Mucous membranes are moist. Neck: Normal ROM Cardiovascular: Normal rate, regular rhythm. Grossly normal heart sounds.  2+ DP pulses bilaterally. Respiratory: Normal respiratory effort.  No retractions. Lungs CTAB. Gastrointestinal: Soft and nontender. No distention. Genitourinary: deferred Musculoskeletal: 1+ pitting edema to left lower extremity and trace pitting edema to right lower extremity distal to knees.  No associated calf tenderness, erythema, warmth, or drainage. Neurologic:  Normal speech and language. No gross focal neurologic deficits are appreciated. Skin:  Skin is warm, dry and intact. No  rash noted. Psychiatric: Mood and affect are normal. Speech and behavior are normal.  ____________________________________________   LABS (all labs ordered are listed, but only abnormal results are displayed)  Labs Reviewed  CBC WITH DIFFERENTIAL/PLATELET - Abnormal; Notable for the following components:      Result Value   Monocytes Absolute 1.2 (*)    All other components within normal limits  BRAIN NATRIURETIC PEPTIDE  COMPREHENSIVE METABOLIC PANEL    PROCEDURES  Procedure(s) performed (including Critical Care):  Procedures   ____________________________________________   INITIAL IMPRESSION / ASSESSMENT AND PLAN / ED COURSE      80 year old female with past medical history of lung cancer, osteoarthritis, asthma, GERD, and DVT on Xarelto who presents to the ED with increasing swelling to her bilateral lower extremities, left greater than right, for the past 5 days.  She has no tenderness on exam and is neurovascularly intact to bilateral lower extremities.  No evidence of cellulitis or other infectious process.  Ultrasound is negative for recurrent DVT and labs are unremarkable.  BNP within normal limits and there is no reason to suspect CHF at this time.  Patient is appropriate for discharge home, was counseled to continue Xarelto and to follow-up with her PCP.  There may be an element of venous insufficiency and she was counseled to trial compression stockings.  She was counseled to return to the ED for new worsening symptoms, patient agrees with plan.      ____________________________________________   FINAL CLINICAL IMPRESSION(S) / ED DIAGNOSES  Final diagnoses:  Peripheral edema  Venous insufficiency     ED Discharge Orders     None        Note:  This document was prepared using Dragon voice recognition software and may include unintentional dictation errors.    Blake Divine, MD 02/11/21 1740

## 2021-02-11 NOTE — ED Triage Notes (Signed)
Pt here with left leg swelling. Pt had a fall in July that resulted in a DVT which resolved but pt thinks has returned. Pt states that her leg is much bigger than normal. Pt also states that it does not hurt but it is painful. Pt in NAD in triage.

## 2021-02-11 NOTE — ED Provider Notes (Signed)
Emergency Medicine Provider Triage Evaluation Note  Crystal Malone , a 80 y.o. female  was evaluated in triage.  Pt complains of left lower extremity edema.  Patient had injured her ankle earlier this year, did not have a fracture but developed a DVT following a trauma.  She been placed on Eliquis, has been doing well until she developed some bronchitis, was not as active.  After this patient noticed that she had had some increased swelling in her lower leg.  She has no history of CHF.  She has been taking her anticoagulation.  No pain to the leg.  Patient is concerned given the increased edema..  Review of Systems  Positive: Left lower extremity edema Negative: Chest pain, shortness of breath, abdominal complaints.  Trauma to the extremity  Physical Exam  BP (!) 151/59   Pulse 68   Temp 97.7 F (36.5 C) (Oral)   Resp 18   Ht 5\' 6"  (1.676 m)   Wt 102.1 kg   SpO2 96%   BMI 36.32 kg/m  Gen:   Awake, no distress   Resp:  Normal effort  MSK:   Moves extremities without difficulty.  Visualization of the left lower extremity reveals slight edema when compared with right.  Full range of motion to all joints.  Nontender to palpation.  Sensation intact all digits.  Dorsalis pedis pulse intact. Other:    Medical Decision Making  Medically screening exam initiated at 3:52 PM.  Appropriate orders placed.  Quinlee Sciarra was informed that the remainder of the evaluation will be completed by another provider, this initial triage assessment does not replace that evaluation, and the importance of remaining in the ED until their evaluation is complete.  Patient presents with unilateral lower extremity edema.  History of DVT and on anticoagulation currently.  No pain, no warmth.  No trauma.  Patient will have basic labs, ultrasound for evaluation.   Brynda Peon 02/11/21 1554    Harvest Dark, MD 02/11/21 [phone]

## 2021-05-27 ENCOUNTER — Other Ambulatory Visit: Payer: Self-pay | Admitting: Physical Medicine & Rehabilitation

## 2021-05-27 ENCOUNTER — Ambulatory Visit
Admission: RE | Admit: 2021-05-27 | Discharge: 2021-05-27 | Disposition: A | Discharge status: Home / Self Care | Payer: Medicare Other | Source: Ambulatory Visit | Attending: Physical Medicine & Rehabilitation | Admitting: Physical Medicine & Rehabilitation

## 2021-05-27 ENCOUNTER — Other Ambulatory Visit: Payer: Self-pay

## 2021-05-27 DIAGNOSIS — M542 Cervicalgia: Secondary | ICD-10-CM

## 2021-06-11 ENCOUNTER — Encounter: Payer: Self-pay | Admitting: Physical Therapy

## 2021-06-11 ENCOUNTER — Ambulatory Visit: Payer: Medicare Other | Attending: Physical Medicine & Rehabilitation | Admitting: Physical Therapy

## 2021-06-11 ENCOUNTER — Other Ambulatory Visit: Payer: Self-pay

## 2021-06-11 DIAGNOSIS — M62838 Other muscle spasm: Secondary | ICD-10-CM | POA: Diagnosis present

## 2021-06-11 DIAGNOSIS — R202 Paresthesia of skin: Secondary | ICD-10-CM | POA: Diagnosis present

## 2021-06-11 DIAGNOSIS — M542 Cervicalgia: Secondary | ICD-10-CM | POA: Insufficient documentation

## 2021-06-11 NOTE — Therapy (Signed)
[facility] [address] [place], [address] Phone: [phone]   Fax:  [phone]  Physical Therapy Evaluation  Patient Details  Name: Crystal Malone MRN: [id number] Date of Birth: 06-06-1940 Referring Provider (PT): Girtha Hake, MD   Encounter Date: 06/11/2021   PT End of Session - 06/11/21 0904     Visit Number 1    Number of Visits 24    Date for PT Re-Evaluation 09/03/21    Authorization Type MEDICARE PART B reporting period from 06/11/2021    Progress Note Due on Visit 10    PT Start Time 0905    PT Stop Time 0947    PT Time Calculation (min) 42 min    Activity Tolerance Patient tolerated treatment well    Behavior During Therapy [facility] for tasks assessed/performed             History reviewed. No pertinent past medical history.  Past Surgical History:  Procedure Laterality Date   BREAST BIOPSY Left    benign     There were no vitals filed for this visit.    Subjective Assessment - 06/11/21 0911     Subjective Patient states condition started when three weeks ago she had a "crick" in her neck. It continued to get bad and got so painful she could barely turn her head. The pain spread along bilateral posterior neck muscles to the base of her head and she felt some paresthesia in the back of her head. It never went down her arms. No numbness/tingling anywhere else.  For that she took muscle relaxants, course of steroids, pain medication (tramadol), hot showers, heating pads, and two massages. That got rid of the main pain. She still has ROM limitations and it still hurts. Dr. Alba Destine her pain doctor recommended PT for her. She is currently on another course of steroids for the gout in her hand, and that is also making her neck feel better. States traction to her cervical spine feels good.    Pertinent History Patient is a 81 y.o. 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 This patient's chief complaints consist of neck pain and stiffness following leading to the following functional deficits: difficulty with driving, turning her head to see blind spot, baking/cooking, volunteering, walking for exercise, any activity, sleeping, fear of recurrence, missed a trip to [place]. .  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  Patient denies hx of stroke, seizures, major cardiac events, diabetes, unexplained weight loss, changes in bowel or bladder problems, new onset stumbling or dropping things, things getting stuck in throat, lumbar surgery. Husband passed away 2 years ago.    Limitations Lifting;Other (comment);House hold activities   difficulty with driving, turning her head to see blind spot, baking/cooking, walking for exercise, any activity, sleeping, fear of recurrence.   Diagnostic tests Cervical spine CT report 05/27/2021: "IMPRESSION:  1. Mild T3 height loss with superior endplate deformity, compatible  with fracture that is age indeterminate without priors, but favored  remote given no associated trabecular sclerosis or discrete lucency.  If there is clinical concern for recent  fracture, MRI could better  evaluate.  2. Bulky right-sided facet/uncovertebral hypertrophy with probably  severe right foraminal stenosis at C3-C4. Probably at least moderate  left foraminal stenosis C5-C6. An MRI could better evaluate the  canal and foramina if clinically indicated.  3. C5-C7 ACDF. The right C7 screw is 2-3 mm proud, which is age  indeterminate. Comparison with outside priors may be  helpful to  assess chronicity if available."    Patient Stated Goals "to strengthen whatever she can" prevent recurreance and improve posture. To not have pain    Currently in Pain? Yes    Pain Score 3    W: 8/10, B: 2/10   Pain Location Neck   bilateral posterior later neck from base of scull to over upper trap (R >L).   Pain Orientation Right;Left    Pain Descriptors / Indicators Dull   previously more "acute" and sharp with movement   Pain Radiating Towards had paresthesia in the head    Pain Onset 1 to 4 weeks ago    Pain Frequency Constant    Aggravating Factors  turning head, activities, breathing, swollowing    Pain Relieving Factors muscle relaxants, course of steroids, pain medication (tramadol), hot showers, heating pads, and two massages    Effect of Pain on Daily Activities Functional Limitations: difficulty with driving, turning her head to see blind spot, baking/cooking, volunteering, walking for exercise, any activity, sleeping, fear of recurrence, missed a trip to [place].               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    Referring Provider (PT) Girtha Hake, MD    Onset Date/Surgical Date 05/21/21    Hand Dominance Right    Next MD Visit approx 07/02/2021    Prior Therapy prior PT for neck but not this episode of pain      Precautions   Precautions Fall      Balance Screen   Has the patient fallen in the past 6 months Yes    How many times? 1   tripped over rolled up rug   Has the patient had a decrease in activity level because of a fear of falling?  No    Is the patient reluctant to leave their home because of a fear of falling?  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      Cognition   Overall Cognitive Status Within Functional Limits for tasks assessed             OBJECTIVE  SELF- REPORTED FUNCTION FOTO score: 47/100 (neck questionnaire)  OBSERVATION/INSPECTION Posture Posture (seated): significant forward head, rounded shoulders, slumped in sitting, increased kyphosis at CT junction and upper thoracic spine.  Advanced arthritic changes noted at B hands (R > L) Posture correction: limited by stiffness.  Anthropometrics Tremor: none Body composition: BMI 36.1 Muscle bulk: no gross abnormalities noted Functional Mobility Bed mobility: supine <>  sit mod I with increased time and care due to low back pain Transfers: sit <> stand I WFL Gait: grossly WFL for household and short community ambulation. More detailed gait analysis deferred to later date as needed.   NEUROLOGICAL Dermatomes C2-T1 appears equal and intact to light touch except the following: except diminished to light touch over C4 and T1 on left compared to right.   SPINE MOTION Cervical Spine AROM *Indicates pain Flexion: 20 tingling in the left finger digits 4-5 Extension: 15 increases neck pain Side Flexion:   R 10 ipsilateral neck pain  L 2 contralateral neck pain Rotation:  R 22 ipsilateral neck pain L 28  mostly contralateral neck pain, mild ipsilateral neck pain   PERIPHERAL JOINT MOTION (in degrees) Active Range of Motion (AROM) Comments: B UE grossly WFL except B shoulder limited to about 60% of full ROM in flexion, abduction, ER, and IR (reports no change from baseline), unable to make fist on R side.   MUSCLE PERFORMANCE (MMT):  B UE grossly WFL except ER: R = 4-/5, L = 3+/5; R grip strength significantly decreased.   SPECIAL TESTS:  CERVICAL  SPINE Cervical spine axial compression: negative Spurling's part B:  R = pain at base of contralateral neck, L = pain at base of contralateral neck Cervical spine axial distraction: positive    ACCESSORY MOTION:  Hypomobile and tender to CPA along upper thoracic and entire cervical spine  PALPATION: TTP grade I over all B posterior cervical spine musculature, scalenes, and SCM. Worst at right suboccipitals and left proximal SCM  Objective measurements completed on examination: See above findings.    TREATMENT:  +RA +latex allergy  Therapeutic exercise: to centralize symptoms and improve ROM, strength, muscular endurance, and activity tolerance required for successful completion of functional activities.  - hookliyng chin tuck 1x5 with 5 second hold - Education on diagnosis, prognosis, POC, anatomy and physiology of current condition.  - Education on HEP including handout   Pt required multimodal cuing for proper technique and to facilitate improved neuromuscular control, strength, range of motion, and functional ability resulting in improved performance and form.  HOME EXERCISE PROGRAM Access Code: [email or web address] URL: [email or web address] Date: 06/11/2021 Prepared by: Rosita Kea  Exercises Supine Deep Neck Flexor Nods - 2 x daily - 2 sets - 10 reps - 5 seconds hold     PT Education - 06/11/21 1013     Education Details Exercise purpose/form. Self management techniques. Education on diagnosis, prognosis, POC, anatomy and physiology of current condition Education on HEP including handout    Person(s) Educated Patient    Methods Explanation;Demonstration;Tactile cues;Verbal cues;Handout    Comprehension Verbalized understanding;Returned demonstration;Verbal cues required;Tactile cues required;Need further instruction              PT Short Term Goals - 06/11/21 1014       PT SHORT TERM GOAL #1   Title Be independent with initial home exercise program for  self-management of symptoms.    Baseline Initial HEP provided at IE (06/11/2021);    Time 2    Period Weeks    Status New    Target Date 06/25/21               PT Long Term Goals - 06/11/21 1014       PT LONG TERM GOAL #1   Title Be independent with a long-term home exercise program for self-management of symptoms.    Baseline Initial HEP provided at IE (06/11/2021);  Time 12    Period Weeks    Status New   TARGET DATE FOR ALL LONG TERM GOALS: 09/03/2021     PT LONG TERM GOAL #2   Title Demonstrate improved FOTO score by 10 units to demonstrate improvement in overall condition and self-reported functional ability.    Baseline 47 (06/11/2021);    Time 12    Period Weeks    Status New      PT LONG TERM GOAL #3   Title Patient will demonstrate cervical spine AROM rotation to equal or greater than 60 degrees each way to improve ability to check blind spot while driving.    Baseline R 22  L 28 (06/11/2021);    Time 12    Period Weeks    Status New      PT LONG TERM GOAL #4   Title Reduce pain to equal or less than 1/10 with functional activities to allow patient to complete valued functional tasks such as sleeping, baking/cooking, volunteering, traveling, and driving with  less difficulty.    Baseline up to 8/10 (06/11/2021);    Time 12    Period Weeks    Status New      PT LONG TERM GOAL #5   Title Complete community, work and/or recreational activities without limitation due to current condition.    Baseline Functional Limitations: difficulty with driving, turning her head to see blind spot, baking/cooking, volunteering, walking for exercise, any activity, sleeping, fear of recurrence, international travel (06/11/2021);    Time 12    Period Weeks    Status New                    Plan - 06/11/21 1009     Clinical Impression Statement Patient is a 81 y.o. 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 Patient presents with significant pain, ROM, posture, joint stiffness, paresthesia, muscle tension, muscle performance (strength/power/endurance), motor control, and activity tolerance impairments that are limiting ability to complete her usual activities including driving, turning her head to see blind spot, baking/cooking, volunteering, walking for exercise, any activity, sleeping without difficulty. She missed a trip to [place] due to her condition and has risk for worsening after she stops taking the steroid taper she is currently on. Patient will benefit from skilled physical therapy intervention to address current body structure impairments and activity limitations to improve function and work towards goals set in current POC in order to return to prior level of function or maximal functional improvement.    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    Examination-Activity Limitations Hygiene/Grooming;Lift;Dressing;Sleep;Reach Overhead    Examination-Participation Restrictions Interpersonal Relationship;Occupation;Community Activity;Cleaning;Laundry;Meal Prep;Other   driving, turning her head to see blind spot, baking/cooking, volunteering, walking for exercise, any activity, sleeping, fear of recurrence, missed a trip to [place].   Stability/Clinical Decision Making Evolving/Moderate  complexity    Clinical Decision Making Moderate    Rehab Potential Good  PT Frequency 2x / week    PT Duration 12 weeks    PT Treatment/Interventions ADLs/Self Care Home Management;Cryotherapy;Electrical Stimulation;Moist Heat;Neuromuscular re-education;Therapeutic exercise;Therapeutic activities;Patient/family education;Manual techniques;Dry needling;Passive range of motion    PT Next Visit Plan update HEP as appropriate, postural strengthening, ROM, manual as needed    PT Home Exercise Plan Medbridge Access Code: [email or web address]    Consulted and Agree with Plan of Care Patient             Patient will benefit from skilled therapeutic intervention in order to improve the following deficits and impairments:  Impaired sensation, Pain, Improper body mechanics, Decreased coordination, Decreased mobility, Increased muscle spasms, Postural dysfunction, Decreased activity tolerance, Decreased endurance, Decreased range of motion, Decreased strength, Hypomobility, Impaired perceived functional ability, Impaired UE functional use, Impaired flexibility, Obesity  Visit Diagnosis: Cervicalgia  Other muscle spasm  Paresthesia of skin     Problem List There are no problems to display for this patient.   Everlean Alstrom. Graylon Good, PT, DPT 06/11/21, 10:21 AM   [facility] [address], [place], [address] Phone: [phone]   Fax:  [phone]  Name: Crystal Malone MRN: [id number] Date of Birth: 11/26/1940

## 2021-06-13 ENCOUNTER — Encounter: Payer: Self-pay | Admitting: Physical Therapy

## 2021-06-13 ENCOUNTER — Ambulatory Visit: Payer: Medicare Other | Admitting: Physical Therapy

## 2021-06-13 ENCOUNTER — Other Ambulatory Visit: Payer: Self-pay

## 2021-06-13 DIAGNOSIS — R202 Paresthesia of skin: Secondary | ICD-10-CM

## 2021-06-13 DIAGNOSIS — M542 Cervicalgia: Secondary | ICD-10-CM

## 2021-06-13 DIAGNOSIS — M62838 Other muscle spasm: Secondary | ICD-10-CM

## 2021-06-13 NOTE — Therapy (Signed)
[facility] [address], [place], [address] Phone: [phone]   Fax:  [phone]  Physical Therapy Treatment  Patient Details  Name: Crystal Malone MRN: [id number] Date of Birth: [birth year] Referring Provider (PT): Girtha Hake, MD   Encounter Date: 06/13/2021   PT End of Session - 06/13/21 1136     Visit Number 2    Number of Visits 24    Date for PT Re-Evaluation 09/03/21    Authorization Type MEDICARE PART B reporting period from 06/11/2021    Progress Note Due on Visit 10    PT Start Time 0900    PT Stop Time 0940    PT Time Calculation (min) 40 min    Activity Tolerance Patient tolerated treatment well    Behavior During Therapy [facility] for tasks assessed/performed             History reviewed. No pertinent past medical history.  Past Surgical History:  Procedure Laterality Date   BREAST BIOPSY Left    benign     There were no vitals filed for this visit.   Subjective Assessment - 06/13/21 0907     Subjective Patient reports no pain upon arrival. She was at the dentist today already. She was a little sore after last PT session and she is doing her HEP which increases her symptoms slightly but they resolve with rest.    Pertinent History Patient is a 81 y.o. 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 This patient's chief complaints consist of neck pain and stiffness following leading to the following functional deficits: difficulty with driving, turning her head to see blind spot, baking/cooking, volunteering, walking for exercise, any activity, sleeping, fear of recurrence, missed a trip to [place]. .  Relevant past medical history and comorbidities include rheumatoid arthritis (started tx), gout (R hand currently), lung cancer (upper right lobe of lung  removed 10 years ago), chronic bronchitis, chronic low back pain (L1 compression fracture), ACDF C5-C7, B knee replacements, bunion surgeries lead to MRSA and lost an inch of left foot.  Patient denies hx of stroke, seizures, major cardiac events, diabetes, unexplained weight loss, changes in bowel or bladder problems, new onset stumbling or dropping things, things getting stuck in throat, lumbar surgery. Husband passed away 2 years ago.    Limitations Lifting;Other (comment);House hold activities   difficulty with driving, turning her head to see blind spot, baking/cooking, walking for exercise, any activity, sleeping, fear of recurrence.   Diagnostic tests Cervical spine CT report 05/27/2021: "IMPRESSION:  1. Mild T3 height loss with superior endplate deformity, compatible  with fracture that is age indeterminate without priors, but favored  remote given no associated trabecular sclerosis or discrete lucency.  If there is clinical concern for recent fracture, MRI could better  evaluate.  2. Bulky right-sided facet/uncovertebral hypertrophy with probably  severe right foraminal stenosis at C3-C4. Probably at least moderate  left foraminal stenosis C5-C6. An MRI could better evaluate the  canal and foramina if clinically indicated.  3. C5-C7 ACDF. The right C7 screw is 2-3 mm proud, which is age  indeterminate. Comparison with outside priors may be helpful to  assess chronicity if available."    Patient Stated Goals "to strengthen whatever she can" prevent recurreance and improve posture. To not have pain    Currently in Pain? No/denies  TREATMENT:  +RA +latex allergy   Therapeutic exercise: to centralize symptoms and improve ROM, strength, muscular endurance, and activity tolerance required for successful completion of functional activities.  - hookliyng chin tuck 1x5 with 5 second hold (Manual therapy - see below) - hookliyng chin tuck 2x10 with 5 second hold (feels better than before  manual).  - seated SNAG rotation, 2x10 each side with 5 second hold at edge of tenderness.  - seated UT stretch, 2x30 seconds each side.  - Education on HEP including handout   Manual therapy: to reduce pain and tissue tension, improve range of motion, neuromodulation, in order to promote improved ability to complete functional activities.  SUPINE with feet elevated - STM to posterior cervical spine musculature focusing on B UT, cervical paraspinals, and suboccipitals. Most concordant tenderness at right suboccipital region.     Pt required multimodal cuing for proper technique and to facilitate improved neuromuscular control, strength, range of motion, and functional ability resulting in improved performance and form.   HOME EXERCISE PROGRAM Access Code: [email or web address] URL: [email or web address] Date: 06/13/2021 Prepared by: Rosita Kea  Exercises Supine Deep Neck Flexor Nods - 2 x daily - 2 sets - 10 reps - 5 seconds hold Seated Assisted Cervical Rotation with Towel - 2 x daily - 2 sets - 10 reps - 5 seconds hold Seated Upper Trapezius Stretch - 2 x daily - 3 sets - 30 seconds hold   PT Education - 06/13/21 1136     Education Details Exercise purpose/form. Self management techniques.    Person(s) Educated Patient    Methods Explanation;Demonstration;Tactile cues;Verbal cues;Handout    Comprehension Verbalized understanding;Returned demonstration;Verbal cues required;Tactile cues required;Need further instruction              PT Short Term Goals - 06/11/21 1014       PT SHORT TERM GOAL #1   Title Be independent with initial home exercise program for self-management of symptoms.    Baseline Initial HEP provided at IE (06/11/2021);    Time 2    Period Weeks    Status New    Target Date 06/25/21               PT Long Term Goals - 06/11/21 1014       PT LONG TERM GOAL #1   Title Be independent with a long-term home exercise program for self-management of  symptoms.    Baseline Initial HEP provided at IE (06/11/2021);    Time 12    Period Weeks    Status New   TARGET DATE FOR ALL LONG TERM GOALS: 09/03/2021     PT LONG TERM GOAL #2   Title Demonstrate improved FOTO score by 10 units to demonstrate improvement in overall condition and self-reported functional ability.    Baseline 47 (06/11/2021);    Time 12    Period Weeks    Status New      PT LONG TERM GOAL #3   Title Patient will demonstrate cervical spine AROM rotation to equal or greater than 60 degrees each way to improve ability to check blind spot while driving.    Baseline R 22  L 28 (06/11/2021);    Time 12    Period Weeks    Status New      PT LONG TERM GOAL #4   Title Reduce pain to equal or less than 1/10 with functional activities to allow patient to complete valued functional tasks such as sleeping, baking/cooking, volunteering, traveling, and driving  with  less difficulty.    Baseline up to 8/10 (06/11/2021);    Time 12    Period Weeks    Status New      PT LONG TERM GOAL #5   Title Complete community, work and/or recreational activities without limitation due to current condition.    Baseline Functional Limitations: difficulty with driving, turning her head to see blind spot, baking/cooking, volunteering, walking for exercise, any activity, sleeping, fear of recurrence, international travel (06/11/2021);    Time 12    Period Weeks    Status New                   Plan - 06/13/21 0944     Clinical Impression Statement Patient tolerated treatment well overall with no increase in pain by end of session. Manual therapy was utilized to decrease muscle tension and pain and this was followed by exercises to improve motor control and ROM. Patient was TTP and continues to have limited ROM and joint stiffness. Plan to continue working towards improved pain control and ROM. Patient would benefit from continued management of limiting condition by skilled physical therapist  to address remaining impairments and functional limitations to work towards stated goals and return to PLOF or maximal functional independence.    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    Examination-Activity Limitations Hygiene/Grooming;Lift;Dressing;Sleep;Reach Overhead    Examination-Participation Restrictions Interpersonal Relationship;Occupation;Community Activity;Cleaning;Laundry;Meal Prep;Other   driving, turning her head to see blind spot, baking/cooking, volunteering, walking for exercise, any activity, sleeping, fear of recurrence, missed a trip to [place].   Stability/Clinical Decision Making Evolving/Moderate complexity    Rehab Potential Good    PT Frequency 2x / week    PT Duration 12 weeks    PT Treatment/Interventions ADLs/Self Care Home Management;Cryotherapy;Electrical Stimulation;Moist Heat;Neuromuscular re-education;Therapeutic exercise;Therapeutic activities;Patient/family education;Manual techniques;Dry needling;Passive range of motion    PT Next Visit Plan update HEP as appropriate, postural strengthening, ROM, manual as needed    PT Home Exercise Plan Medbridge Access Code: [email or web address]    Consulted and Agree with Plan of Care Patient             Patient will benefit from skilled therapeutic intervention in order to improve the following deficits and impairments:  Impaired sensation, Pain, Improper body mechanics, Decreased coordination, Decreased mobility, Increased muscle spasms, Postural dysfunction, Decreased activity  tolerance, Decreased endurance, Decreased range of motion, Decreased strength, Hypomobility, Impaired perceived functional ability, Impaired UE functional use, Impaired flexibility, Obesity  Visit Diagnosis: Cervicalgia  Other muscle spasm  Paresthesia of skin     Problem List There are no problems to display for this patient.   Everlean Alstrom. Graylon Good, PT, DPT 06/13/21, 11:38 AM   [facility] [address] [place], [address] Phone: [phone]   Fax:  [phone]  Name: Joelys Staubs MRN: [id number] Date of Birth: April 24, 1940

## 2021-06-18 ENCOUNTER — Encounter: Payer: TRICARE For Life (TFL) | Admitting: Physical Therapy

## 2021-06-20 ENCOUNTER — Ambulatory Visit: Payer: Medicare Other | Attending: Physical Medicine & Rehabilitation | Admitting: Physical Therapy

## 2021-06-20 ENCOUNTER — Encounter: Payer: Self-pay | Admitting: Physical Therapy

## 2021-06-20 ENCOUNTER — Other Ambulatory Visit: Payer: Self-pay

## 2021-06-20 DIAGNOSIS — R202 Paresthesia of skin: Secondary | ICD-10-CM | POA: Diagnosis present

## 2021-06-20 DIAGNOSIS — M62838 Other muscle spasm: Secondary | ICD-10-CM | POA: Insufficient documentation

## 2021-06-20 DIAGNOSIS — M542 Cervicalgia: Secondary | ICD-10-CM | POA: Diagnosis present

## 2021-06-20 NOTE — Therapy (Signed)
[facility] [address], [place], [address] Phone: [phone]   Fax:  [phone]  Physical Therapy Treatment  Patient Details  Name: Crystal Malone MRN: [id number] Date of Birth: [birth year] Referring Provider (PT): Girtha Hake, MD   Encounter Date: 06/20/2021   PT End of Session - 06/20/21 0912     Visit Number 3    Number of Visits 24    Date for PT Re-Evaluation 09/03/21    Authorization Type MEDICARE PART B reporting period from 06/11/2021    Progress Note Due on Visit 10    PT Start Time 0905    PT Stop Time 0945    PT Time Calculation (min) 40 min    Activity Tolerance Patient tolerated treatment well    Behavior During Therapy [facility] for tasks assessed/performed             History reviewed. No pertinent past medical history.  Past Surgical History:  Procedure Laterality Date   BREAST BIOPSY Left    benign     There were no vitals filed for this visit.   Subjective Assessment - 06/20/21 0909     Subjective Patient reports her neck has been feeling better and she has no neck pain upon arrival. Her neck is still feeling stiff. She was not sore after her last PT session. 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 She was provided tylenol for post-surgical pain if needed. She states her HEP is going so so. She did her HEP as prescribed until she had the surgery yesterday.    Pertinent History Patient is a 81 y.o. 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 This patient's chief complaints consist of neck pain and stiffness following leading to the following functional deficits: difficulty with driving, turning her head to see blind spot, baking/cooking,  volunteering, walking for exercise, any activity, sleeping, fear of recurrence, missed a trip to [place]. .  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  Patient denies hx of stroke, seizures, major cardiac events, diabetes, unexplained weight loss, changes in bowel or bladder problems, new onset stumbling or dropping things, things getting stuck in throat, lumbar surgery. Husband passed away 2 years ago.    Limitations Lifting;Other (comment);House hold activities   difficulty with driving, turning her head to see blind spot, baking/cooking, walking for exercise, any activity, sleeping, fear of recurrence.   Diagnostic tests Cervical spine CT report 05/27/2021: "IMPRESSION:  1. Mild T3 height loss with superior endplate deformity, compatible  with fracture that is age indeterminate without priors, but favored  remote given no associated trabecular sclerosis or discrete lucency.  If there is clinical concern for recent fracture, MRI could better  evaluate.  2. Bulky right-sided facet/uncovertebral hypertrophy with probably  severe right foraminal stenosis at C3-C4. Probably at least moderate  left foraminal stenosis C5-C6. An MRI could better evaluate the  canal and foramina if clinically indicated.  3. C5-C7 ACDF. The right C7 screw is 2-3 mm  proud, which is age  indeterminate. Comparison with outside priors may be helpful to  assess chronicity if available."    Patient Stated Goals "to strengthen whatever she can" prevent recurreance and improve posture. To not have pain    Currently in Pain? No/denies               TREATMENT:  +RA +latex allergy   Therapeutic exercise: to centralize symptoms and improve  ROM, strength, muscular endurance, and activity tolerance required for successful completion of functional activities.  - Upper body ergometer level 5 to encourage joint nutrition, warm tissue, induce analgesic effect of aerobic exercise, improve muscular strength and endurance,  and prepare for remainder of session.5 min changing direcitons every minute. Seat position 6 (Manual therapy - see below) - hookliyng chin tuck 1x5 with 5 second hold - hooklying chin tuck with left, 1x10 with 10 second hold - seated cervical spine rotation with self overpressure, 1x10 each side with 5 second hold at edge of tenderness.  - Education on HEP including handout    Manual therapy: to reduce pain and tissue tension, improve range of motion, neuromodulation, in order to promote improved ability to complete functional activities.  SUPINE with feet elevated - STM to posterior cervical spine musculature focusing on B UT, cervical paraspinals, and suboccipitals. Most concordant tenderness at right suboccipital region.  - R upper cervical spine mobilization grade III to tolerance - Cervical spine PROM rotation with 5 second hold/oscillations at end range, 1x10 each side - upper cervical spine PROM with neck flexed with 5 second hold/oscilations at end range, 1x10 each side to tolerance     Pt required multimodal cuing for proper technique and to facilitate improved neuromuscular control, strength, range of motion, and functional ability resulting in improved performance and form.   HOME EXERCISE PROGRAM Access Code: [email or web address] URL: [email or web address] Date: 06/13/2021 Prepared by: Rosita Kea   Exercises Supine Deep Neck Flexor Nods - 2 x daily - 2 sets - 10 reps - 5 seconds hold Seated Assisted Cervical Rotation with Towel - 2 x daily - 2 sets - 10 reps - 5 seconds hold Seated Upper Trapezius Stretch - 2 x daily - 3 sets - 30 seconds hold    PT Education - 06/20/21 0912     Education  Details Exercise purpose/form. Self management techniques.    Person(s) Educated Patient    Methods Explanation;Demonstration;Tactile cues;Verbal cues    Comprehension Verbalized understanding;Returned demonstration;Verbal cues required;Tactile cues required;Need further instruction              PT Short Term Goals - 06/20/21 1345       PT SHORT TERM GOAL #1   Title Be independent with initial home exercise program for self-management of symptoms.    Baseline Initial HEP provided at IE (06/11/2021);    Time 2    Period Weeks    Status Achieved    Target Date 06/25/21               PT Long Term Goals - 06/11/21 1014       PT LONG TERM GOAL #1   Title Be independent with a long-term home exercise program for self-management of symptoms.    Baseline Initial HEP provided at IE (06/11/2021);    Time 12    Period Weeks    Status New   TARGET DATE FOR ALL LONG TERM GOALS: 09/03/2021     PT LONG TERM GOAL #2   Title Demonstrate improved FOTO  score by 10 units to demonstrate improvement in overall condition and self-reported functional ability.    Baseline 47 (06/11/2021);    Time 12    Period Weeks    Status New      PT LONG TERM GOAL #3   Title Patient will demonstrate cervical spine AROM rotation to equal or greater than 60 degrees each way to improve ability to check blind spot while driving.    Baseline R 22  L 28 (06/11/2021);    Time 12    Period Weeks    Status New      PT LONG TERM GOAL #4   Title Reduce pain to equal or less than 1/10 with functional activities to allow patient to complete valued functional tasks such as sleeping, baking/cooking, volunteering, traveling, and driving with  less difficulty.    Baseline up to 8/10 (06/11/2021);    Time 12    Period Weeks    Status New      PT LONG TERM GOAL #5   Title Complete community, work and/or recreational activities without limitation due to current condition.    Baseline Functional Limitations: difficulty  with driving, turning her head to see blind spot, baking/cooking, volunteering, walking for exercise, any activity, sleeping, fear of recurrence, international travel (06/11/2021);    Time 12    Period Weeks    Status New                   Plan - 06/20/21 1344     Clinical Impression Statement Patient arrives today with bandage on upper left anterior neck after having some skin removed yesterday. Kept exercises light and focused more on manual therapy this session to decrease stress to this region. Patient was able to progress to chin tuck with lift without increased pain. Patient continues to be painful and limited to cervical rotation R > L especially at the top of the cervical spine. Plan to continue postural strengthening and mobility interventions next session as appropriate. Patient would benefit from continued management of limiting condition by skilled physical therapist to address remaining impairments and functional limitations to work towards stated goals and return to PLOF or maximal functional independence.    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    Examination-Activity Limitations Hygiene/Grooming;Lift;Dressing;Sleep;Reach Overhead    Examination-Participation Restrictions Interpersonal Relationship;Occupation;Community Activity;Cleaning;Laundry;Meal Prep;Other   driving, turning her head to see blind spot, baking/cooking, volunteering, walking for exercise, any activity, sleeping, fear of recurrence, missed a trip to [place].    Stability/Clinical Decision Making Evolving/Moderate complexity    Rehab Potential Good    PT Frequency 2x / week    PT Duration 12 weeks    PT Treatment/Interventions ADLs/Self Care Home Management;Cryotherapy;Electrical Stimulation;Moist Heat;Neuromuscular re-education;Therapeutic exercise;Therapeutic activities;Patient/family education;Manual techniques;Dry needling;Passive range of motion    PT Next Visit Plan update HEP as appropriate, postural strengthening, ROM, manual as needed    PT Home Exercise Plan Medbridge Access Code: [email or web address]    Consulted and Agree with Plan of Care Patient             Patient will benefit from skilled therapeutic intervention in order to  improve the following deficits and impairments:  Impaired sensation, Pain, Improper body mechanics, Decreased coordination, Decreased mobility, Increased muscle spasms, Postural dysfunction, Decreased activity tolerance, Decreased endurance, Decreased range of motion, Decreased strength, Hypomobility, Impaired perceived functional ability, Impaired UE functional use, Impaired flexibility, Obesity  Visit Diagnosis: Cervicalgia  Other muscle spasm  Paresthesia of skin     Problem List There are no problems to display for this patient.   Everlean Alstrom. Graylon Good, PT, DPT 06/20/21, 1:46 PM   [facility] [address] [place], [address] Phone: [phone]   Fax:  [phone]  Name: Leyanna Bittman MRN: [id number] Date of Birth: 1940-05-19

## 2021-06-25 ENCOUNTER — Encounter: Payer: TRICARE For Life (TFL) | Admitting: Physical Therapy

## 2021-06-27 ENCOUNTER — Encounter: Payer: TRICARE For Life (TFL) | Admitting: Physical Therapy

## 2021-07-02 ENCOUNTER — Encounter: Payer: Self-pay | Admitting: Physical Therapy

## 2021-07-02 ENCOUNTER — Ambulatory Visit: Payer: Medicare Other | Admitting: Physical Therapy

## 2021-07-02 ENCOUNTER — Other Ambulatory Visit: Payer: Self-pay

## 2021-07-02 DIAGNOSIS — M542 Cervicalgia: Secondary | ICD-10-CM

## 2021-07-02 DIAGNOSIS — M62838 Other muscle spasm: Secondary | ICD-10-CM

## 2021-07-02 DIAGNOSIS — R202 Paresthesia of skin: Secondary | ICD-10-CM

## 2021-07-02 NOTE — Therapy (Signed)
[place] [facility] ?[address] ?[place], [place], [address] ?Phone: [phone]   Fax:  [phone] ? ?Physical Therapy Treatment ? ?Patient Details  ?Name: Crystal Malone ?MRN: [id number] ?Date of Birth: 04-06-1941 ?Referring Provider (PT): Girtha Hake, MD ? ? ?Encounter Date: 07/02/2021 ? ? PT End of Session - 07/02/21 0910   ? ? Visit Number 4   ? Number of Visits 24   ? Date for PT Re-Evaluation 09/03/21   ? Authorization Type MEDICARE PART B reporting period from 06/11/2021   ? Progress Note Due on Visit 10   ? PT Start Time [phone]   ? PT Stop Time [phone]   ? PT Time Calculation (min) 38 min   ? Activity Tolerance Patient tolerated treatment well   ? Behavior During Therapy [facility] for tasks assessed/performed   ? ?  ?  ? ?  ? ? ?History reviewed. No pertinent past medical history. ? ?Past Surgical History:  ?Procedure Laterality Date  ? BREAST BIOPSY Left   ? benign   ? ? ?There were no vitals filed for this visit. ? ? Subjective Assessment - 07/02/21 0908   ? ? Subjective Patient reports she had a great trip and her neck has been doing pretty good. She states she did pretty good with her HEP while out of town. She reports no neck pain this morning. She states her neck used to hurt mostly on the right and that is a lot better but the left is troublesome sometimes. In general she has felt much better. She states her exercises are a little boring but not too easy.   ? Pertinent History Patient is a 81 y.o. 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 This patient's chief complaints consist of neck pain and stiffness following leading to the following functional deficits: difficulty with driving, turning her head to see blind spot, baking/cooking, volunteering, walking for exercise, any activity, sleeping, fear of recurrence, missed  a trip to [place]. .  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  Patient denies hx of stroke, seizures, major cardiac events, diabetes, unexplained weight loss, changes in bowel or bladder problems, new onset stumbling or dropping things, things getting stuck in throat, lumbar surgery. Husband passed away 2 years ago.   ? Limitations Lifting;Other (comment);House hold activities   difficulty with driving, turning her head to see blind spot, baking/cooking, walking for exercise, any activity, sleeping, fear of recurrence.  ? Diagnostic tests Cervical spine CT report 05/27/2021: "IMPRESSION:  1. Mild T3 height loss with superior endplate deformity, compatible  with fracture that is age indeterminate without priors, but favored  remote given no associated trabecular sclerosis or discrete lucency.  If there is clinical concern for recent fracture, MRI could better  evaluate.  2. Bulky right-sided facet/uncovertebral hypertrophy with probably  severe right foraminal stenosis at C3-C4. Probably at least moderate  left foraminal stenosis C5-C6. An MRI could better evaluate the  canal and foramina if clinically indicated.  3. C5-C7 ACDF. The right C7 screw is 2-3 mm proud, which is age  indeterminate. Comparison with outside priors may  be helpful to  assess chronicity if available."   ? Patient Stated Goals "to strengthen whatever she can" prevent recurreance and improve posture. To not have pain   ? Currently in Pain? No/denies   ? ?  ?  ? ?  ? ? ? ?TREATMENT:  ?+RA ?+latex allergy ?  ?Therapeutic exercise: to centralize symptoms and improve ROM, strength, muscular endurance, and activity tolerance required for successful  completion of functional activities.  ?- Upper body ergometer level 5 to encourage joint nutrition, warm tissue, induce analgesic effect of aerobic exercise, improve muscular strength and endurance,  and prepare for remainder of session. 5 min changing direcitons every minute. Seat position 6. ? ?(Manual therapy - see below) ? ?- hooklying chin tuck with lift, 2x10 with 5 second hold ?- seated cervical retraction AROM, 1x10 ?- seated cervical retraction with self overpressure, 1x5 ?- Education on HEP including handout  ?  ?Manual therapy: to reduce pain and tissue tension, improve range of motion, neuromodulation, in order to promote improved ability to complete functional activities.  ?SUPINE/HOOKLYING ?- STM to posterior cervical spine musculature focusing on B UT, cervical paraspinals, and suboccipitals. Most concordant tenderness at left upper trap and L mid paraspinals.  ?- R and L  upper cervical spine mobilization grade III to tolerance, 1x10 each side.  ?- R and left PROM UT stretch, 1x30 seconds.  ?- Cervical spine PROM rotation with 5 second hold at end range, 1x10 each side. 5-10 second hold.  ?- upper cervical spine PROM with neck flexed with 5 second hold at end range, 1x10 each side to tolerance ?  ?  ?Pt required multimodal cuing for proper technique and to facilitate improved neuromuscular control, strength, range of motion, and functional ability resulting in improved performance and form. ?  ?HOME EXERCISE PROGRAM ?Access Code: [email or web address] ?URL: [email or web address] ?Date: 07/02/2021 ?Prepared by: Rosita Kea ? ?Exercises ?Supine Deep Neck Flexor Training - Hold - 2 x daily - 2-3 sets - 10 reps - 5 seconds hold ?Seated Cervical Rotation Stretch - 2 x daily - 2 sets - 10 reps - 5 seconds hold ?Seated Upper Trapezius Stretch - 2 x daily - 3 sets - 30 seconds hold ?Cervical Retraction with Overpressure - 2 x daily - 2 sets - 10 reps - 1 second hold ? ? ? ? PT Education - 07/02/21 0910    ? ? Education Details Exercise purpose/form. Self management techniques.   ? Person(s) Educated Patient   ? Methods Explanation;Demonstration;Tactile cues;Verbal cues   ? Comprehension Verbalized understanding;Returned demonstration;Verbal cues required;Tactile cues required;Need further instruction   ? ?  ?  ? ?  ? ? ? PT Short Term Goals - 06/20/21 1345   ? ?  ? PT SHORT TERM GOAL #1  ? Title Be independent with initial home exercise program for self-management of symptoms.   ? Baseline Initial HEP provided at IE (06/11/2021);   ? Time 2   ? Period Weeks   ? Status Achieved   ? Target Date 06/25/21   ? ?  ?  ? ?  ? ? ? ? PT Long Term Goals - 06/11/21 1014   ? ?  ? PT LONG TERM GOAL #1  ? Title Be independent with a long-term home exercise program for self-management of symptoms.   ? Baseline Initial HEP provided at IE (06/11/2021);   ? Time 12   ? Period Weeks   ? Status New   TARGET DATE FOR ALL LONG TERM GOALS:  09/03/2021  ?  ? PT LONG TERM GOAL #2  ? Title Demonstrate improved FOTO score by 10 units to demonstrate improvement in overall condition and self-reported functional ability.   ? Baseline 47 (06/11/2021);   ? Time 12   ? Period Weeks   ? Status New   ?  ? PT LONG TERM GOAL #3  ? Title Patient will demonstrate cervical spine AROM rotation to equal or greater than 60 degrees each way to improve ability to check blind spot while driving.   ? Baseline R 22  L 28 (06/11/2021);   ? Time 12   ? Period Weeks   ? Status New   ?  ? PT LONG TERM GOAL #4  ? Title Reduce pain to equal or less than 1/10 with functional activities to allow patient to complete valued functional tasks such as sleeping, baking/cooking, volunteering, traveling, and driving with  less difficulty.   ? Baseline up to 8/10 (06/11/2021);   ? Time 12   ? Period Weeks   ? Status New   ?  ? PT LONG TERM GOAL #5  ? Title Complete community, work and/or recreational activities without limitation due to current condition.   ? Baseline Functional  Limitations: difficulty with driving, turning her head to see blind spot, baking/cooking, volunteering, walking for exercise, any activity, sleeping, fear of recurrence, international travel (06/11/2021);   ? Time 12

## 2021-07-04 ENCOUNTER — Ambulatory Visit: Payer: Medicare Other | Admitting: Physical Therapy

## 2021-07-04 ENCOUNTER — Other Ambulatory Visit: Payer: Self-pay

## 2021-07-04 ENCOUNTER — Encounter: Payer: Self-pay | Admitting: Physical Therapy

## 2021-07-04 DIAGNOSIS — M542 Cervicalgia: Secondary | ICD-10-CM | POA: Diagnosis not present

## 2021-07-04 NOTE — Therapy (Signed)
[place] [facility] ?[address] ?[place], [place], [address] ?Phone: [phone]   Fax:  [phone] ? ?Physical Therapy Treatment ? ?Patient Details  ?Name: Crystal Malone ?MRN: [id number] ?Date of Birth: Sep 12, 1940 ?Referring Provider (PT): Girtha Hake, MD ? ? ?Encounter Date: 07/04/2021 ? ? PT End of Session - 07/04/21 0909   ? ? Visit Number 5   ? Number of Visits 24   ? Date for PT Re-Evaluation 09/03/21   ? Authorization Type MEDICARE PART B reporting period from 06/11/2021   ? Progress Note Due on Visit 10   ? PT Start Time 0902   ? PT Stop Time [phone]   ? PT Time Calculation (min) 40 min   ? Activity Tolerance Patient tolerated treatment well   ? Behavior During Therapy [facility] for tasks assessed/performed   ? ?  ?  ? ?  ? ? ?History reviewed. No pertinent past medical history. ? ?Past Surgical History:  ?Procedure Laterality Date  ? BREAST BIOPSY Left   ? benign   ? ? ?There were no vitals filed for this visit. ? ? Subjective Assessment - 07/04/21 0906   ? ? Subjective Patient reports she has no pain upon arrival. She states she felt good after last PT session and her neck continues to feel better. She states her HEP is going well. Patient saw Dr. Alba Destine two days ago and they agreed if patient needs something she will call her.   ? Pertinent History Patient is a 81 y.o. 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 This patient's chief complaints consist of neck pain and stiffness following leading to the following functional deficits: difficulty with driving, turning her head to see blind spot, baking/cooking, volunteering, walking for exercise, any activity, sleeping, fear of recurrence, missed a trip to [place]. .  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  Patient denies hx of stroke, seizures, major cardiac events, diabetes, unexplained weight loss, changes in bowel or bladder problems, new onset stumbling or dropping things, things getting stuck in throat, lumbar surgery. Husband passed away 2 years ago.   ? Limitations Lifting;Other (comment);House hold activities   difficulty with driving, turning her head to see blind spot, baking/cooking, walking for exercise, any activity, sleeping, fear of recurrence.  ? Diagnostic tests Cervical spine CT report 05/27/2021: "IMPRESSION:  1. Mild T3 height loss with superior endplate deformity, compatible  with fracture that is age indeterminate without priors, but favored  remote given no associated trabecular sclerosis or discrete lucency.  If there is clinical concern for recent fracture, MRI could better  evaluate.  2. Bulky right-sided facet/uncovertebral hypertrophy with probably  severe right foraminal stenosis at C3-C4. Probably at least moderate  left foraminal stenosis C5-C6. An MRI could better evaluate the  canal and foramina if clinically indicated.  3. C5-C7 ACDF. The right C7 screw is 2-3 mm proud, which is age  indeterminate. Comparison with outside priors may be helpful to  assess chronicity if available."   ? Patient Stated Goals "to strengthen whatever she can" prevent recurreance and improve posture. To not have pain   ?  Currently in Pain? No/denies   ? ?  ?  ? ?  ? ? ?OBJECTIVE ? ?SELF-REPORTED FUNCTION ?FOTO score: 51/100 (neck questionnaire) ? ? ?TREATMENT:  ?+RA ?+latex allergy ?  ?Therapeutic exercise: to centralize symptoms and improve ROM, strength, muscular endurance, and activity tolerance required for successful completion of functional activities.  ?- Upper body  ergometer level 5 to encourage joint nutrition, warm tissue, induce analgesic effect of aerobic exercise, improve muscular strength and endurance,  and prepare for remainder of session. 7 min changing direcitons every minute. Seat position 6. ?(Manual therapy - see below) ?- hooklying chin tuck, 1x10 with 5 second hold  ?- hooklying chin tuck with lift, 1x10 with 5 second hold ?  ?Manual therapy: to reduce pain and tissue tension, improve range of motion, neuromodulation, in order to promote improved ability to complete functional activities.  ?SUPINE/HOOKLYING ?- STM to posterior cervical spine musculature focusing on B UT, cervical paraspinals, and suboccipitals.  ?- STM to B SCM (L > R). ?- R and L  upper cervical spine mobilization grade III to tolerance, 1x10 each side.  ?- Cervical spine PROM rotation with 5 second hold at end range, 1x10 each side. 5-10 second hold.  ?- upper cervical spine PROM with neck flexed with 5 second hold at end range, 1x10 each side to tolerance ?  ?  ?Pt required multimodal cuing for proper technique and to facilitate improved neuromuscular control, strength, range of motion, and functional ability resulting in improved performance and form. ?  ?HOME EXERCISE PROGRAM ?Access Code: [email or web address] ?URL: [email or web address] ?Date: 07/02/2021 ?Prepared by: Rosita Kea ?  ?Exercises ?Supine Deep Neck Flexor Training - Hold - 2 x daily - 2-3 sets - 10 reps - 5 seconds hold ?Seated Cervical Rotation Stretch - 2 x daily - 2 sets - 10 reps - 5 seconds hold ?Seated Upper Trapezius Stretch - 2 x daily - 3 sets - 30 seconds hold ?Cervical Retraction with Overpressure - 2 x daily - 2 sets - 10 reps - 1 second hold ? ? PT Education - 07/04/21 0908   ? ? Education Details Exercise purpose/form. Self management techniques.   ? Person(s) Educated Patient   ? Methods Explanation;Demonstration;Tactile cues;Verbal cues   ? Comprehension Verbalized understanding;Returned demonstration;Verbal  cues required;Tactile cues required;Need further instruction   ? ?  ?  ? ?  ? ? ? PT Short Term Goals - 06/20/21 1345   ? ?  ? PT SHORT TERM GOAL #1  ? Title Be independent with initial home exercise program for self-management of symptoms.   ? Baseline Initial HEP provided at IE (06/11/2021);   ? Time 2   ? Period Weeks   ? Status Achieved   ? Target Date 06/25/21   ? ?  ?  ? ?  ? ? ? ? PT Long Term Goals - 06/11/21 1014   ? ?  ? PT LONG TERM GOAL #1  ? Title Be independent with a long-term home exercise program for self-management of symptoms.   ? Baseline Initial HEP provided at IE (06/11/2021);   ? Time 12   ? Period Weeks   ? Status New   TARGET DATE FOR ALL LONG TERM GOALS: 09/03/2021  ?  ? PT LONG TERM GOAL #2  ? Title Demonstrate improved FOTO score by 10 units to demonstrate improvement in overall condition and self-reported functional ability.   ? Baseline 47 (06/11/2021);   ? Time 12   ? Period Weeks   ?  Status New   ?  ? PT LONG TERM GOAL #3  ? Title Patient will demonstrate cervical spine AROM rotation to equal or greater than 60 degrees each way to improve ability to check blind spot while driving.   ? Baseline R 22  L 28 (06/11/2021);   ? Time 12   ? Period Weeks   ? Status New   ?  ? PT LONG TERM GOAL #4  ? Title Reduce pain to equal or less than 1/10 with functional activities to allow patient to complete valued functional tasks such as sleeping, baking/cooking, volunteering, traveling, and driving with  less difficulty.   ? Baseline up to 8/10 (06/11/2021);   ? Time 12   ? Period Weeks   ? Status New   ?  ? PT LONG TERM GOAL #5  ? Title Complete community, work and/or recreational activities without limitation due to current condition.   ? Baseline Functional Limitations: difficulty with driving, turning her head to see blind spot, baking/cooking, volunteering, walking for exercise, any activity, sleeping, fear of recurrence, international travel (06/11/2021);   ? Time 12   ? Period Weeks   ? Status New    ? ?  ?  ? ?  ? ? ? ? ? ? ? ? Plan - 07/04/21 1806   ? ? Clinical Impression Statement Patient tolerated treatment well and continues to report improvements in pain in her neck as well as sensation of tight

## 2021-07-09 ENCOUNTER — Encounter: Payer: Self-pay | Admitting: Physical Therapy

## 2021-07-09 ENCOUNTER — Other Ambulatory Visit: Payer: Self-pay

## 2021-07-09 ENCOUNTER — Telehealth: Payer: Self-pay | Admitting: Physical Therapy

## 2021-07-09 ENCOUNTER — Ambulatory Visit: Payer: Medicare Other | Admitting: Physical Therapy

## 2021-07-09 DIAGNOSIS — M542 Cervicalgia: Secondary | ICD-10-CM | POA: Diagnosis not present

## 2021-07-09 DIAGNOSIS — M62838 Other muscle spasm: Secondary | ICD-10-CM

## 2021-07-09 DIAGNOSIS — R202 Paresthesia of skin: Secondary | ICD-10-CM

## 2021-07-09 NOTE — Telephone Encounter (Signed)
Called patient when she did not come on time to her appointment scheduled for 9am today. Left VM to call back. Then patient came to her appointment.  ? ?Crystal Malone. Graylon Good, PT, DPT ?07/09/21, 9:12 AM ? ?

## 2021-07-09 NOTE — Therapy (Signed)
?OUTPATIENT PHYSICAL THERAPY TREATMENT NOTE ? ? ?Patient Name: Crystal Malone ?MRN: [id number] ?DOB:[birth year], 81 y.o., female ?Today's Date: 07/09/2021 ? ?PCP: Leonel Ramsay, MD ?REFERRING PROVIDER:  Girtha Hake, MD ? ? PT End of Session - 07/09/21 0914   ? ? Visit Number 6   ? Number of Visits 24   ? Date for PT Re-Evaluation 09/03/21   ? Authorization Type MEDICARE PART B reporting period from 06/11/2021   ? Progress Note Due on Visit 10   ? PT Start Time 0911   ? PT Stop Time 0949   ? PT Time Calculation (min) 38 min   ? Activity Tolerance Patient tolerated treatment well   ? Behavior During Therapy [facility] for tasks assessed/performed   ? ?  ?  ? ?  ? ? ?History reviewed. No pertinent past medical history. ?Past Surgical History:  ?Procedure Laterality Date  ? BREAST BIOPSY Left   ? benign   ? ?There are no problems to display for this patient. ? ? 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 ? ?THERAPY DIAG:  ?Cervicalgia ? ?Other muscle spasm ? ?Paresthesia of skin ? ?PERTINENT HISTORY: Patient is a 81 y.o. 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 This patient's chief complaints consist of neck pain and stiffness following leading to the following functional deficits: difficulty with driving, turning her head to see blind spot, baking/cooking, volunteering, walking for exercise, any activity, sleeping, fear of recurrence, missed a trip to [place]. . 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 Patient denies hx of  stroke, seizures, major cardiac events, diabetes, unexplained weight loss, changes in bowel or bladder problems, new onset stumbling or dropping things, things getting stuck in throat, lumbar surgery. Husband passed away 2 years ago ? ?PRECAUTIONS: fall risk ? ?SUBJECTIVE: Patient reports no pain upon arrival when still but does have some left sided neck pain when turning to the left. She went to the beach over the weekend and her friends could tell when her neck was hurting. Her neck is still stiff, R > L rotation. She has been doing HEP.  ? ?PAIN:  ?Are you having pain? No ? ?OBJECTIVE ? Cervical AROM ? Rotation: R = 34, L = 36 ? ?TODAY'S TREATMENT:  ?+RA ?+latex allergy ?  ?Therapeutic exercise: to centralize symptoms and improve ROM, strength, muscular endurance, and activity tolerance required for successful completion of functional activities.  ?- Upper body ergometer level 6-7 to encourage joint nutrition, warm tissue, induce analgesic effect of aerobic exercise, improve muscular strength and endurance,  and prepare for remainder of session. 5 min changing direcitons every minute. Seat position 6. ? ?(Manual therapy - see below) ? ?- hooklying chin tuck with lift, 1x10 with 5 second hold ?- standing scapular row, 3x10 at 10#/GTB ?- Education on HEP including handout  ? ?  ?Manual therapy: to reduce pain and tissue tension, improve range of motion, neuromodulation, in order to promote improved ability to complete functional activities.  ?SUPINE/HOOKLYING ?- STM to posterior cervical spine musculature focusing on B UT, cervical paraspinals, and suboccipitals.  ?- STM to B  SCM (L > R). ?- R and L  upper cervical spine mobilization grade III to tolerance, 1x10 each side.  ?- Cervical spine PROM rotation with 5 second hold at end range, 1x10 each side. 5 second hold/oscillation.  ?- upper cervical spine PROM with neck flexed with 5 second hold/oscillation at end range, 1x10 each side to tolerance ?  ?  ?Pt  required multimodal cuing for proper technique and to facilitate improved neuromuscular control, strength, range of motion, and functional ability resulting in improved performance and form ? ? ?PATIENT EDUCATION: ?Education details: Exercise purpose/form. Self management techniques. ?Person educated: Patient ?Education method: Explanation, Demonstration, Tactile cues, and Verbal cues ?Education comprehension: verbalized understanding, returned demonstration, verbal cues required, tactile cues required, and needs further education ? ? ?HOME EXERCISE PROGRAM: ?Access Code: [email or web address] ?URL: [email or web address] ?Date: 07/09/2021 ?Prepared by: Rosita Kea ? ?Exercises ?Supine Deep Neck Flexor Training - Hold - 2 x daily - 2-3 sets - 10 reps - 5 seconds hold ?Seated Cervical Rotation Stretch - 2 x daily - 2 sets - 10 reps - 5 seconds hold ?Seated Upper Trapezius Stretch - 2 x daily - 3 sets - 30 seconds hold ?Cervical Retraction with Overpressure - 2 x daily - 2 sets - 10 reps - 1 second hold ?Row with band/cable - 1 x daily - 3 sets - 10 reps - 2 seconds hold ? ? ? PT Short Term Goals   ? ?  ? PT SHORT TERM GOAL #1  ? Title Be independent with initial home exercise program for self-management of symptoms.   ? Baseline Initial HEP provided at IE (06/11/2021);   ? Time 2   ? Period Weeks   ? Status Achieved   ? Target Date 06/25/21   ? ?  ?  ? ?  ? ? ? PT Long Term Goals - 07/09/21 0943   ?TARGET DATE FOR ALL LONG TERM GOALS: 09/03/2021 ?  ? PT LONG TERM GOAL #1  ? Title Be independent with a long-term home exercise program for self-management of symptoms.   ? Baseline Initial HEP provided at IE (06/11/2021);   ? Time 12   ? Period Weeks   ? Status Partially Met   ?  ? PT LONG TERM GOAL #2  ? Title Demonstrate improved FOTO score by 10 units to demonstrate improvement in overall condition and self-reported functional ability.   ? Baseline 47 (06/11/2021); 51 at visit #5 (07/04/2021);   ? Time 12   ? Period Weeks    ? Status Partially Met   ?  ? PT LONG TERM GOAL #3  ? Title Patient will demonstrate cervical spine AROM rotation to equal or greater than 60 degrees each way to improve ability to check blind spot while driving.   ? Baseline R 22  L 28 (06/11/2021); R 34  L 36  (07/09/2021);   ? Time 12   ? Period Weeks   ? Status Partially Met   ?  ? PT LONG TERM GOAL #4  ? Title Reduce pain to equal or less than 1/10 with functional activities to allow patient to complete valued functional tasks such as sleeping, baking/cooking, volunteering, traveling, and driving with  less difficulty.   ? Baseline up to 8/10 (06/11/2021); 5/10 (07/09/2021);   ? Time 12   ? Period Weeks   ? Status Partially Met   ?  ? PT LONG TERM GOAL #5  ? Title Complete community, work and/or recreational activities without limitation due  to current condition.   ? Baseline Functional Limitations: difficulty with driving, turning her head to see blind spot, baking/cooking, volunteering, walking for exercise, any activity, sleeping, fear of recurrence, international travel (06/11/2021);   ? Time 12   ? Period Weeks   ? Status On-going   ? ?  ?  ? ?  ? ? ? Plan   ? ? Clinical Impression Statement Patient tolerated treatment well overall and demonstrates improved cervical spine rotation AROM as well as reports less pain compared to when she started PT. Session continued to focus on improving cervical spine ROM and postural strengthening. Plan to progress postural strengthening and continue manual interventions for ROM as tolerated next session. Patient would benefit from continued management of limiting condition by skilled physical therapist to address remaining impairments and functional limitations to work towards stated goals and return to PLOF or maximal functional independence.  ?  ? Personal Factors and Comorbidities Comorbidity 3+;Past/Current Experience;Fitness;Time since onset of injury/illness/exacerbation;Age   ? 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

## 2021-07-11 ENCOUNTER — Ambulatory Visit: Payer: Medicare Other | Admitting: Physical Therapy

## 2021-07-11 ENCOUNTER — Encounter: Payer: Self-pay | Admitting: Physical Therapy

## 2021-07-11 ENCOUNTER — Other Ambulatory Visit: Payer: Self-pay

## 2021-07-11 DIAGNOSIS — R202 Paresthesia of skin: Secondary | ICD-10-CM

## 2021-07-11 DIAGNOSIS — M542 Cervicalgia: Secondary | ICD-10-CM

## 2021-07-11 DIAGNOSIS — M62838 Other muscle spasm: Secondary | ICD-10-CM

## 2021-07-11 NOTE — Therapy (Signed)
?OUTPATIENT PHYSICAL THERAPY TREATMENT NOTE ? ? ?Patient Name: Crystal Malone ?MRN: [id number] ?DOB:1940-07-04, 81 y.o., female ?Today's Date: 07/11/2021 ? ?PCP: Leonel Ramsay, MD ?REFERRING PROVIDER:  Girtha Hake, MD ? ? PT End of Session - 07/11/21 0905   ? ? Visit Number 7   ? Number of Visits 24   ? Date for PT Re-Evaluation 09/03/21   ? Authorization Type MEDICARE PART B reporting period from 06/11/2021   ? Progress Note Due on Visit 10   ? PT Start Time [phone]   ? PT Stop Time [id number]   ? PT Time Calculation (min) 40 min   ? Activity Tolerance Patient tolerated treatment well   ? Behavior During Therapy [facility] for tasks assessed/performed   ? ?  ?  ? ?  ? ? ? ?History reviewed. No pertinent past medical history. ?Past Surgical History:  ?Procedure Laterality Date  ? BREAST BIOPSY Left   ? benign   ? ?There are no problems to display for this patient. ? ? 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 ? ?THERAPY DIAG:  ?Cervicalgia ? ?Other muscle spasm ? ?Paresthesia of skin ? ?PERTINENT HISTORY: Patient is a 81 y.o. 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 This patient's chief complaints consist of neck pain and stiffness following leading to the following functional deficits: difficulty with driving, turning her head to see blind spot, baking/cooking, volunteering, walking for exercise, any activity, sleeping, fear of recurrence, missed a trip to [place]. . 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 Patient denies hx of  stroke, seizures, major cardiac events, diabetes, unexplained weight loss, changes in bowel or bladder problems, new onset stumbling or dropping things, things getting stuck in throat, lumbar surgery. Husband passed away 2 years ago ? ?PRECAUTIONS: fall risk ? ?SUBJECTIVE: Patient reports she is feeling well today with no pain upon arrival. She reports she felt good after last PT session. She has a very busy day with a lot of driving today. Her HEP is going fine.  ? ?PAIN:  ?Are you having pain? No ? ?OBJECTIVE ? Cervical AROM (last measured 07/09/2021) ? Rotation: R = 34, L = 36  ? ?TODAY'S TREATMENT:  ?+RA ?+latex allergy ?  ?Therapeutic exercise: to centralize symptoms and improve ROM, strength, muscular endurance, and activity tolerance required for successful completion of functional activities.  ?- Upper body ergometer level 5-6 to encourage joint nutrition, warm tissue, induce analgesic effect of aerobic exercise, improve muscular strength and endurance,  and prepare for remainder of session. 5 min changing direcitons every minute. Seat position 6. ? ?(Manual therapy - see below) ? ?- hooklying chin tuck with lift, 1x10 with 10 second hold ?- standing cervical retraction at wall, 1x10, 10 second hold.  ?- standing wall angel 1x10 (discontinued due to L shoulder pain).  ?- standing B shoulder ER with scapular retraction holding YTB, 3x10.  ?  ?Manual therapy: to reduce pain and tissue tension, improve range of motion, neuromodulation, in order to promote improved ability to complete functional activities.  ?SUPINE/HOOKLYING ?- STM to posterior cervical spine musculature focusing on B  UT, cervical paraspinals, and suboccipitals.  ?- R and L  upper cervical spine mobilization grade III to tolerance, 1x10 each side. (Unable to feel at upper c-spine) ?- Cervical spine PROM rotation with 5 second hold at end range, 1x10 each side. 5 second hold/oscillation.  ?- upper cervical spine PROM with neck flexed with 5  second hold/oscillation at end range, 1x10 each side to tolerance ?  ?Pt required multimodal cuing for proper technique and to facilitate improved neuromuscular control, strength, range of motion, and functional ability resulting in improved performance and form ? ? ?PATIENT EDUCATION: ?Education details: Exercise purpose/form. Self management techniques. ?Person educated: Patient ?Education method: Explanation, Demonstration, Tactile cues, and Verbal cues ?Education comprehension: verbalized understanding, returned demonstration, verbal cues required, tactile cues required, and needs further education ? ? ?HOME EXERCISE PROGRAM: ?Access Code: [email or web address] ?URL: [email or web address] ?Date: 07/09/2021 ?Prepared by: Rosita Kea ? ?Exercises ?Supine Deep Neck Flexor Training - Hold - 2 x daily - 2-3 sets - 10 reps - 5 seconds hold ?Seated Cervical Rotation Stretch - 2 x daily - 2 sets - 10 reps - 5 seconds hold ?Seated Upper Trapezius Stretch - 2 x daily - 3 sets - 30 seconds hold ?Cervical Retraction with Overpressure - 2 x daily - 2 sets - 10 reps - 1 second hold ?Row with band/cable - 1 x daily - 3 sets - 10 reps - 2 seconds hold ? ? ? PT Short Term Goals   ? ?  ? PT SHORT TERM GOAL #1  ? Title Be independent with initial home exercise program for self-management of symptoms.   ? Baseline Initial HEP provided at IE (06/11/2021);   ? Time 2   ? Period Weeks   ? Status Achieved   ? Target Date 06/25/21   ? ?  ?  ? ?  ? ? ? PT Long Term Goals - 07/09/21 0943   ?TARGET DATE FOR ALL LONG TERM GOALS: 09/03/2021 ?  ? PT LONG TERM GOAL #1  ? Title Be independent with a long-term home exercise program for self-management of symptoms.   ? Baseline Initial HEP provided at IE (06/11/2021);   ? Time 12   ? Period Weeks   ? Status Partially Met   ?  ? PT LONG TERM GOAL #2  ? Title Demonstrate improved FOTO score by 10 units to demonstrate improvement in overall condition and self-reported functional ability.   ? Baseline  47 (06/11/2021); 51 at visit #5 (07/04/2021);   ? Time 12   ? Period Weeks   ? Status Partially Met   ?  ? PT LONG TERM GOAL #3  ? Title Patient will demonstrate cervical spine AROM rotation to equal or greater than 60 degrees each way to improve ability to check blind spot while driving.   ? Baseline R 22  L 28 (06/11/2021); R 34  L 36  (07/09/2021);   ? Time 12   ? Period Weeks   ? Status Partially Met   ?  ? PT LONG TERM GOAL #4  ? Title Reduce pain to equal or less than 1/10 with functional activities to allow patient to complete valued functional tasks such as sleeping, baking/cooking, volunteering, traveling, and driving with  less difficulty.   ? Baseline up to 8/10 (06/11/2021); 5/10 (07/09/2021);   ? Time 12   ? Period Weeks   ? Status Partially Met   ?  ? PT LONG TERM GOAL #5  ? Title Complete community, work and/or  recreational activities without limitation due to current condition.   ? Baseline Functional Limitations: difficulty with driving, turning her head to see blind spot, baking/cooking, volunteering, walking for exercise, any activity, sleeping, fear of recurrence, international travel (06/11/2021);   ? Time 12   ? Period Weeks   ? Status On-going   ? ?  ?  ? ?  ? ? ? Plan   ? ? Clinical Impression Statement Patient tolerated treatment well overall and continues to report improved motion and comfort following manual therapy and exercise. Patient continues to have significant ROM limitations that affects her driving. Plan to continue with similar interventions next session as appropriate. Patient would benefit from continued management of limiting condition by skilled physical therapist to address remaining impairments and functional limitations to work towards stated goals and return to PLOF or maximal functional independence.  ?  ? Personal Factors and Comorbidities Comorbidity 3+;Past/Current Experience;Fitness;Time since onset of injury/illness/exacerbation;Age   ? 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

## 2021-07-15 ENCOUNTER — Other Ambulatory Visit: Payer: Self-pay | Admitting: Obstetrics and Gynecology

## 2021-07-15 ENCOUNTER — Other Ambulatory Visit: Payer: Self-pay | Admitting: Infectious Diseases

## 2021-07-15 DIAGNOSIS — Z1231 Encounter for screening mammogram for malignant neoplasm of breast: Secondary | ICD-10-CM

## 2021-07-16 ENCOUNTER — Ambulatory Visit: Payer: Medicare Other | Admitting: Physical Therapy

## 2021-07-16 ENCOUNTER — Other Ambulatory Visit: Payer: Self-pay

## 2021-07-16 ENCOUNTER — Encounter: Payer: Self-pay | Admitting: Physical Therapy

## 2021-07-16 DIAGNOSIS — R202 Paresthesia of skin: Secondary | ICD-10-CM

## 2021-07-16 DIAGNOSIS — M62838 Other muscle spasm: Secondary | ICD-10-CM

## 2021-07-16 DIAGNOSIS — M542 Cervicalgia: Secondary | ICD-10-CM | POA: Diagnosis not present

## 2021-07-16 NOTE — Therapy (Signed)
?OUTPATIENT PHYSICAL THERAPY TREATMENT NOTE ? ? ?Patient Name: Crystal Malone ?MRN: [id number] ?DOB:[birth year], 81 y.o., female ?Today's Date: 07/16/2021 ? ?PCP: Leonel Ramsay, MD ?REFERRING PROVIDER:  Girtha Hake, MD ? ? PT End of Session - 07/16/21 0910   ? ? Visit Number 8   ? Number of Visits 24   ? Date for PT Re-Evaluation 09/03/21   ? Authorization Type MEDICARE PART B reporting period from 06/11/2021   ? Progress Note Due on Visit 10   ? PT Start Time [phone]   ? PT Stop Time [phone]   ? PT Time Calculation (min) 40 min   ? Activity Tolerance Patient tolerated treatment well   ? Behavior During Therapy [facility] for tasks assessed/performed   ? ?  ?  ? ?  ? ? ? ? ?History reviewed. No pertinent past medical history. ?Past Surgical History:  ?Procedure Laterality Date  ? BREAST BIOPSY Left   ? benign   ? ?There are no problems to display for this patient. ? ? 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 ? ?THERAPY DIAG:  ?Cervicalgia ? ?Other muscle spasm ? ?Paresthesia of skin ? ?PERTINENT HISTORY: Patient is a 81 y.o. 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 This patient's chief complaints consist of neck pain and stiffness following leading to the following functional deficits: difficulty with driving, turning her head to see blind spot, baking/cooking, volunteering, walking for exercise, any activity, sleeping, fear of recurrence, missed a trip to [place]. . 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 Patient denies hx of  stroke, seizures, major cardiac events, diabetes, unexplained weight loss, changes in bowel or bladder problems, new onset stumbling or dropping things, things getting stuck in throat, lumbar surgery. Husband passed away 2 years ago ? ?PRECAUTIONS: fall risk ? ?SUBJECTIVE: Patient reports she has 4-5/10 pain in her right neck. She skipped her HEP for two days because she ached all over for 2 days for no apparent reason (she does not feel that she was sick). She states the UBE seems to be causing increasing pain in her left upper arm and requests it be modified or not completed today.  ? ?PAIN:  ?Are you having pain? Yes, 4-5/10 at right neck constantly ? ?OBJECTIVE ? Cervical AROM (last measured 07/09/2021) ? Rotation: R = 34, L = 36  ? ?TODAY'S TREATMENT:  ?+RA ?+latex allergy ?  ?Therapeutic exercise: to centralize symptoms and improve ROM, strength, muscular endurance, and activity tolerance required for successful completion of functional activities.  ?- NuStep level 1 using bilateral upper and lower extremities. Seat/handle setting 11/10. For improved extremity mobility, muscular endurance, and activity tolerance; and to induce the analgesic effect of aerobic exercise, stimulate improved joint nutrition, and prepare body structures and systems for following interventions. x 6  minutes. Average SPM = 75. ? ?(Manual therapy - see below) ? ?- hooklying chin tuck with lift, 1x10 with 5 second hold ?  ?Manual therapy: to reduce pain and tissue tension, improve range of motion, neuromodulation, in order to promote improved ability to complete functional activities.  ?SUPINE/HOOKLYING ?- STM to  posterior cervical spine musculature focusing on tender portions of right UT, cervical paraspinals, and suboccipitals.  ?- Cervical spine PROM rotation with overpressure to tolerance, 1x15 each side. 5 second hold/oscillation.  ?- right PROM UT stretch, 3x30 seconds ?  ?Pt required multimodal cuing for proper technique and to  facilitate improved neuromuscular control, strength, range of motion, and functional ability resulting in improved performance and form ? ? ?PATIENT EDUCATION: ?Education details: Exercise purpose/form. Self management techniques. ?Person educated: Patient ?Education method: Explanation, Demonstration, Tactile cues, and Verbal cues ?Education comprehension: verbalized understanding, returned demonstration, verbal cues required, tactile cues required, and needs further education ? ? ?HOME EXERCISE PROGRAM: ?Access Code: [email or web address] ?URL: [email or web address] ?Date: 07/09/2021 ?Prepared by: Rosita Kea ? ?Exercises ?Supine Deep Neck Flexor Training - Hold - 2 x daily - 2-3 sets - 10 reps - 5 seconds hold ?Seated Cervical Rotation Stretch - 2 x daily - 2 sets - 10 reps - 5 seconds hold ?Seated Upper Trapezius Stretch - 2 x daily - 3 sets - 30 seconds hold ?Cervical Retraction with Overpressure - 2 x daily - 2 sets - 10 reps - 1 second hold ?Row with band/cable - 1 x daily - 3 sets - 10 reps - 2 seconds hold ? ? ? PT Short Term Goals   ? ?  ? PT SHORT TERM GOAL #1  ? Title Be independent with initial home exercise program for self-management of symptoms.   ? Baseline Initial HEP provided at IE (06/11/2021);   ? Time 2   ? Period Weeks   ? Status Achieved   ? Target Date 06/25/21   ? ?  ?  ? ?  ? ? ? PT Long Term Goals - 07/09/21 0943   ?TARGET DATE FOR ALL LONG TERM GOALS: 09/03/2021 ?  ? PT LONG TERM GOAL #1  ? Title Be independent with a long-term home exercise program for self-management of symptoms.   ? Baseline Initial HEP provided at IE (06/11/2021);   ? Time 12   ? Period Weeks   ? Status Partially Met   ?  ? PT LONG TERM GOAL #2  ? Title Demonstrate improved FOTO score by 10 units to demonstrate improvement in overall condition and self-reported functional ability.   ? Baseline 47 (06/11/2021); 51 at visit #5 (07/04/2021);   ? Time 12   ? Period Weeks   ? Status Partially Met   ?  ? PT LONG TERM GOAL #3   ? Title Patient will demonstrate cervical spine AROM rotation to equal or greater than 60 degrees each way to improve ability to check blind spot while driving.   ? Baseline R 22  L 28 (06/11/2021); R 34  L 36  (07/09/2021);   ? Time 12   ? Period Weeks   ? Status Partially Met   ?  ? PT LONG TERM GOAL #4  ? Title Reduce pain to equal or less than 1/10 with functional activities to allow patient to complete valued functional tasks such as sleeping, baking/cooking, volunteering, traveling, and driving with  less difficulty.   ? Baseline up to 8/10 (06/11/2021); 5/10 (07/09/2021);   ? Time 12   ? Period Weeks   ? Status Partially Met   ?  ? PT LONG TERM GOAL #5  ? Title Complete community, work and/or recreational activities without limitation due to current condition.   ? Baseline Functional Limitations: difficulty with driving, turning her head to see blind spot, baking/cooking, volunteering, walking for  exercise, any activity, sleeping, fear of recurrence, international travel (06/11/2021);   ? Time 12   ? Period Weeks   ? Status On-going   ? ?  ?  ? ?  ? ? ? Plan   ? ? Clinical Impression Statement Patient tolerated treatment well overall but continues to have significant pain, tightness, and tenderness over the right cervical spine region. Discussed dry needling and patient would like to try next session. Interventions continued to focus on decreasing pain and stiffness in the neck as well as postural strengthening as tolerated. Plan to incorporate dry needling into treatment next session. Patient would benefit from continued management of limiting condition by skilled physical therapist to address remaining impairments and functional limitations to work towards stated goals and return to PLOF or maximal functional independence.  ?  ? Personal Factors and Comorbidities Comorbidity 3+;Past/Current Experience;Fitness;Time since onset of injury/illness/exacerbation;Age   ? 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

## 2021-07-18 ENCOUNTER — Encounter: Payer: Self-pay | Admitting: Physical Therapy

## 2021-07-18 ENCOUNTER — Ambulatory Visit: Payer: Medicare Other | Admitting: Physical Therapy

## 2021-07-18 DIAGNOSIS — R202 Paresthesia of skin: Secondary | ICD-10-CM

## 2021-07-18 DIAGNOSIS — M542 Cervicalgia: Secondary | ICD-10-CM

## 2021-07-18 DIAGNOSIS — M62838 Other muscle spasm: Secondary | ICD-10-CM

## 2021-07-18 NOTE — Therapy (Signed)
?OUTPATIENT PHYSICAL THERAPY TREATMENT NOTE ? ? ?Patient Name: Crystal Malone ?MRN: [id number] ?DOB:1941/03/09, 81 y.o., female ?Today's Date: 07/18/2021 ? ?PCP: Leonel Ramsay, MD ?REFERRING PROVIDER:  Girtha Hake, MD ? ? PT End of Session - 07/18/21 0908   ? ? Visit Number 9   ? Number of Visits 24   ? Date for PT Re-Evaluation 09/03/21   ? Authorization Type MEDICARE PART B reporting period from 06/11/2021   ? Progress Note Due on Visit 10   ? PT Start Time [phone]   ? PT Stop Time 0950   ? PT Time Calculation (min) 45 min   ? Activity Tolerance Patient tolerated treatment well   ? Behavior During Therapy [facility] for tasks assessed/performed   ? ?  ?  ? ?  ? ? ? ? ? ?History reviewed. No pertinent past medical history. ?Past Surgical History:  ?Procedure Laterality Date  ? BREAST BIOPSY Left   ? benign   ? ?There are no problems to display for this patient. ? ? 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 ? ?THERAPY DIAG:  ?Cervicalgia ? ?Other muscle spasm ? ?Paresthesia of skin ? ?PERTINENT HISTORY: Patient is a 81 y.o. 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 This patient's chief complaints consist of neck pain and stiffness following leading to the following functional deficits: difficulty with driving, turning her head to see blind spot, baking/cooking, volunteering, walking for exercise, any activity, sleeping, fear of recurrence, missed a trip to [place]. . 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 Patient denies hx  of stroke, seizures, major cardiac events, diabetes, unexplained weight loss, changes in bowel or bladder problems, new onset stumbling or dropping things, things getting stuck in throat, lumbar surgery. Husband passed away 2 years ago ? ?PRECAUTIONS: fall risk ? ?SUBJECTIVE: Patient report she is feeling well with no pain upon arrival except at her low back. She felt better after her last PT session.  ? ?PAIN:  ?Are you having pain? Yes, 4/10 low back when she twists and bends ? ?OBJECTIVE ? Cervical AROM (last measured 07/09/2021) ? Rotation: R = 34, L = 36  ? ?TODAY'S TREATMENT:  ?+RA ?+latex allergy ?  ?Therapeutic exercise: to centralize symptoms and improve ROM, strength, muscular endurance, and activity tolerance required for successful completion of functional activities.  ?- NuStep level 2 using bilateral upper and lower extremities. Seat/handle setting 11/10. For improved extremity mobility, muscular endurance, and activity tolerance; and to induce the analgesic effect of aerobic exercise, stimulate improved joint nutrition, and prepare body structures and systems for following interventions. x 6:30  minutes. Average SPM = 79. ?- standing rows with cable, 1x10 at 10# ?- seated rows at [place], 2x15 at 10# ?- seated lat pull, 3x10 at 15# ?- seated chest press at [place], 1x3at 5# (straining) ?- hooklying with feet elevated, chest press with plus using 3# DB in each hand, 3x10  ?  ?Manual therapy: to reduce pain and tissue tension, improve range of motion, neuromodulation, in order to promote improved ability to complete functional activities.  ?SUPINE/HOOKLYING ?- STM to  posterior cervical spine musculature focusing on tender portions of right UT, cervical paraspinals, and suboccipitals.  ?- Cervical spine PROM rotation with overpressure to tolerance, 1x15 each side. 5 second hold/oscillation.  ?- right PROM UT stretch, 3x30 seconds ?- cervical manual traction, intermittent up to 30 seconds.  ?   ?Pt required multimodal cuing for proper technique and to facilitate improved neuromuscular control, strength, range of motion, and functional ability resulting in improved performance and form ? ? ?PATIENT EDUCATION: ?Education details: Exercise purpose/form. Self management techniques. ?Person educated: Patient ?Education method: Explanation, Demonstration, Tactile cues, and Verbal cues ?Education comprehension: verbalized understanding, returned demonstration, verbal cues required, tactile cues required, and needs further education ? ? ?HOME EXERCISE PROGRAM: ?Access Code: [email or web address] ?URL: [email or web address] ?Date: 07/09/2021 ?Prepared by: Rosita Kea ? ?Exercises ?Supine Deep Neck Flexor Training - Hold - 2 x daily - 2-3 sets - 10 reps - 5 seconds hold ?Seated Cervical Rotation Stretch - 2 x daily - 2 sets - 10 reps - 5 seconds hold ?Seated Upper Trapezius Stretch - 2 x daily - 3 sets - 30 seconds hold ?Cervical Retraction with Overpressure - 2 x daily - 2 sets - 10 reps - 1 second hold ?Row with band/cable - 1 x daily - 3 sets - 10 reps - 2 seconds hold ? ? ? PT Short Term Goals   ? ?  ? PT SHORT TERM GOAL #1  ? Title Be independent with initial home exercise program for self-management of symptoms.   ? Baseline Initial HEP provided at IE (06/11/2021);   ? Time 2   ? Period Weeks   ? Status Achieved   ? Target Date 06/25/21   ? ?  ?  ? ?  ? ? ? PT Long Term Goals - 07/09/21 0943   ?TARGET DATE FOR ALL LONG TERM GOALS: 09/03/2021 ?  ? PT LONG TERM GOAL #1  ? Title Be independent with a long-term home exercise program for self-management of symptoms.   ? Baseline Initial HEP provided at IE (06/11/2021);   ? Time 12   ? Period Weeks   ? Status Partially Met   ?  ? PT LONG TERM GOAL #2  ? Title Demonstrate improved FOTO score by 10 units to demonstrate improvement in overall condition and self-reported functional ability.   ? Baseline 47 (06/11/2021); 51 at visit #5 (07/04/2021);   ? Time 12   ? Period  Weeks   ? Status Partially Met   ?  ? PT LONG TERM GOAL #3  ? Title Patient will demonstrate cervical spine AROM rotation to equal or greater than 60 degrees each way to improve ability to check blind spot while driving.   ? Baseline R 22  L 28 (06/11/2021); R 34  L 36  (07/09/2021);   ? Time 12   ? Period Weeks   ? Status Partially Met   ?  ? PT LONG TERM GOAL #4  ? Title Reduce pain to equal or less than 1/10 with functional activities to allow patient to complete valued functional tasks such as sleeping, baking/cooking, volunteering, traveling, and driving with  less difficulty.   ? Baseline up to 8/10 (06/11/2021); 5/10 (07/09/2021);   ? Time 12   ? Period Weeks   ? Status Partially Met   ?  ? PT LONG TERM GOAL #5  ? Title Complete community, work and/or recreational activities without limitation due to current condition.   ? Baseline Functional Limitations: difficulty with driving, turning  her head to see blind spot, baking/cooking, volunteering, walking for exercise, any activity, sleeping, fear of recurrence, international travel (06/11/2021);   ? Time 12   ? Period Weeks   ? Status On-going   ? ?  ?  ? ?  ? ? ? Plan   ? ? Clinical Impression Statement Patient tolerated treatment well overall and reported feeling better by end of session. She was able to tolerate progression to more postural strengthening this session. Plan to complete formal progress note next session and advance postural strengthening as appropriate. Patient would benefit from continued management of limiting condition by skilled physical therapist to address remaining impairments and functional limitations to work towards stated goals and return to PLOF or maximal functional independence.   ? Personal Factors and Comorbidities Comorbidity 3+;Past/Current Experience;Fitness;Time since onset of injury/illness/exacerbation;Age   ? 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

## 2021-07-22 ENCOUNTER — Encounter: Payer: Medicare Other | Admitting: Physical Therapy

## 2021-07-29 ENCOUNTER — Ambulatory Visit: Payer: Medicare Other | Attending: Physical Medicine & Rehabilitation | Admitting: Physical Therapy

## 2021-07-29 ENCOUNTER — Encounter: Payer: Self-pay | Admitting: Physical Therapy

## 2021-07-29 DIAGNOSIS — M62838 Other muscle spasm: Secondary | ICD-10-CM | POA: Insufficient documentation

## 2021-07-29 DIAGNOSIS — R202 Paresthesia of skin: Secondary | ICD-10-CM | POA: Diagnosis present

## 2021-07-29 DIAGNOSIS — M542 Cervicalgia: Secondary | ICD-10-CM | POA: Diagnosis present

## 2021-07-29 NOTE — Therapy (Signed)
?OUTPATIENT PHYSICAL THERAPY TREATMENT / PROGRESS NOTE ?Dates of reporting from 06/11/2021 to 07/29/2021 ? ? ?Patient Name: Crystal Malone ?MRN: [id number] ?DOB:June 22, 1940, 81 y.o., female ?Today's Date: 07/29/2021 ? ?PCP: Leonel Ramsay, MD ?REFERRING PROVIDER:  Girtha Hake, MD ? ? PT End of Session - 07/29/21 1044   ? ? Visit Number 10   ? Number of Visits 24   ? Date for PT Re-Evaluation 09/03/21   ? Authorization Type MEDICARE PART B reporting period from 06/11/2021   ? Progress Note Due on Visit 10   ? PT Start Time 6063   ? PT Stop Time 1117   ? PT Time Calculation (min) 38 min   ? Activity Tolerance Patient tolerated treatment well   ? Behavior During Therapy [facility] for tasks assessed/performed   ? ?  ?  ? ?  ? ? ? ? ? ? ?History reviewed. No pertinent past medical history. ?Past Surgical History:  ?Procedure Laterality Date  ? BREAST BIOPSY Left   ? benign   ? ?There are no problems to display for this patient. ? ? 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 ? ?THERAPY DIAG:  ?Cervicalgia ? ?Other muscle spasm ? ?Paresthesia of skin ? ?PERTINENT HISTORY: Patient is a 81 y.o. 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 This patient's chief complaints consist of neck pain and stiffness following leading to the following functional deficits: difficulty with driving, turning her head to see blind spot, baking/cooking, volunteering, walking for exercise, any activity, sleeping, fear of recurrence, missed a trip to [place]. . 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 Patient denies hx of stroke, seizures, major cardiac events, diabetes, unexplained weight loss, changes in bowel or bladder problems, new onset stumbling or dropping things, things getting stuck in throat, lumbar surgery. Husband passed away 2 years ago ? ?PRECAUTIONS: fall risk ? ?SUBJECTIVE: Patient reports she is feeling well with no pain in her neck. She does have some neck discomfort when she gets to end range rotation. She states her back is "there." She felt okay after last PT session. She had a massage this past Friday and her massage therapist said her neck and shoulders were "so much better." HEP is going fair and that she was a little slack this weekend. Patient reports the highest level of pain in her neck in the last 2 weeks is "not much" maybe a 5-6/10. She has been working really hard on her seated posture and holding her head up and she feels this helps. She states she feels PT is helping because she feels better and she can turn her head when driving without worrying too much.  ? ?PAIN:  ?Are you having pain? Yes, 4/10 low back  ? ?OBJECTIVE ?SPINE MOTION ?Cervical Spine AROM ?*Indicates pain ?Flexion: 45 ?Extension: 15 increases neck pain right ?Side Flexion:  ?      R 20 ipsilateral neck pain ?      L 15 contralateral neck stretch ?Rotation:  ? Before stretching/manual: ?R 24 ipsilateral end range neck pain ?L 32  mostly end range contralateral neck pain ?After  stretching/manual: ?R 40, end range pain ?L 44 end range pain ? ?TODAY'S TREATMENT:  ?+RA ?+latex allergy ?  ?Therapeutic exercise: to centralize symptoms and improve ROM, strength, muscular endurance, and activity tolerance required for successful completion of functional activities.  ?- NuStep level 2 using bilateral upper and lower extremities. Seat/handle setting 11/10. For improved extremity mobility, muscular endurance, and activity tolerance; and to induce the analgesic effect of aerobic  exercise, stimulate improved joint nutrition, and prepare body structures and systems for following interventions. x 8  minutes. Average SPM = 78. ?- measurements to assess progress (see above) ?- seated rows at [place], 2x15 at 10# ?- seated lat pull, 2x10 at 15# ?- hooklying with feet elevated, chest press with plus using 3# DB in each hand, 2x10  ?  ?Manual therapy: to reduce pain and tissue tension, improve range of motion, neuromodulation, in order to promote improved ability to complete functional activities.  ?SUPINE/HOOKLYING ?- STM to posterior cervical spine musculature focusing on tender portions of r cervical paraspinals, and suboccipitals.  ?- cervical manual traction, 3x 30 seconds.  ?- Cervical spine PROM rotation with overpressure to tolerance, 1x15 each side. 5 second hold/oscillation.  ? ?Pt required multimodal cuing for proper technique and to facilitate improved neuromuscular control, strength, range of motion, and functional ability resulting in improved performance and form ? ? ?PATIENT EDUCATION: ?Education details: Exercise purpose/form. Self management techniques. ?Person educated: Patient ?Education method: Explanation, Demonstration, Tactile cues, and Verbal cues ?Education comprehension: verbalized understanding, returned demonstration, verbal cues required, tactile cues required, and needs further education ? ? ?HOME EXERCISE PROGRAM: ?Access Code: [email or web address] ?URL: [email or web address] ?Date: 07/09/2021 ?Prepared by: Rosita Kea ? ?Exercises ?Supine Deep Neck Flexor Training - Hold - 2 x daily - 2-3 sets - 10 reps - 5 seconds hold ?Seated Cervical Rotation Stretch - 2 x daily - 2 sets - 10 reps - 5 seconds hold ?Seated Upper Trapezius Stretch - 2 x daily - 3 sets - 30 seconds hold ?Cervical Retraction with Overpressure - 2 x daily - 2 sets - 10 reps - 1 second hold ?Row with band/cable - 1 x daily - 3 sets - 10 reps - 2 seconds hold ? ? ? PT Short Term Goals   ? ?  ?  PT SHORT TERM GOAL #1  ? Title Be independent with initial home exercise program for self-management of symptoms.   ? Baseline Initial HEP provided at IE (06/11/2021);   ? Time 2   ? Period Weeks   ? Status Achieved   ? Target Date 06/25/21   ? ?  ?  ? ?  ? ? ? PT Long Term Goals - 07/09/21 0943   ?TARGET DATE FOR ALL LONG TERM GOALS: 09/03/2021 ?  ? PT LONG TERM GOAL #1  ? Title Be independent with a long-term home exercise program for self-management of symptoms.   ? Baseline Initial HEP provided at IE (06/11/2021); patient participating well most of the time (07/29/2021);   ? Time 12   ? Period Weeks   ? Status Partially Met   ?  ? PT LONG TERM GOAL #2  ? Title Demonstrate improved FOTO score by 10 units to demonstrate improvement in overall condition and self-reported functional ability.   ? Baseline 47 (06/11/2021); 51 at visit #5 (07/04/2021);   ? Time 12   ? Period Weeks   ? Status Partially Met   ?  ? PT LONG TERM GOAL #3  ? Title Patient will  demonstrate cervical spine AROM rotation to equal or greater than 60 degrees each way to improve ability to check blind spot while driving.   ? Baseline R 22  L 28 (06/11/2021); R 34  L 36  (07/09/2021); R 40 L 44 (07/29/2021);   ? Time 12   ? Period Weeks   ? Status Partially Met   ?  ? PT LONG TERM GOAL #4  ? Title Reduce pain to equal or less than 1/10 with functional activities to allow patient to complete valued functional tasks such as sleeping, baking/cooking, volunteering, traveling, and driving with  less difficulty.   ? Baseline up to 8/10 (06/11/2021); 5/10 (07/09/2021); 5-6/10 (07/29/2021);   ? Time 12   ? Period Weeks   ? Status Partially Met   ?  ? PT LONG TERM GOAL #5  ? Title Complete community, work and/or recreational activities without limitation due to current condition.   ? Baseline Functional Limitations: difficulty with driving, turning her head to see blind spot, baking/cooking, volunteering, walking for exercise, any activity, sleeping, fear of recurrence,  international travel (06/11/2021); everything is better but she has not decided about international travel. She wants to go to [place] (07/29/2021);   ? Time 12   ? Period Weeks   ? Status On-going   ? ?  ?

## 2021-07-31 ENCOUNTER — Encounter: Payer: Self-pay | Admitting: Physical Therapy

## 2021-07-31 ENCOUNTER — Ambulatory Visit: Payer: Medicare Other | Admitting: Physical Therapy

## 2021-07-31 DIAGNOSIS — M542 Cervicalgia: Secondary | ICD-10-CM | POA: Diagnosis not present

## 2021-07-31 DIAGNOSIS — R202 Paresthesia of skin: Secondary | ICD-10-CM

## 2021-07-31 DIAGNOSIS — M62838 Other muscle spasm: Secondary | ICD-10-CM

## 2021-07-31 NOTE — Therapy (Signed)
?OUTPATIENT PHYSICAL THERAPY TREATMENT NOTE ? ? ?Patient Name: Crystal Malone ?MRN: [id number] ?DOB:Nov 06, 1940, 81 y.o., female ?Today's Date: 07/31/2021 ? ?PCP: Leonel Ramsay, MD ?REFERRING PROVIDER:  Girtha Hake, MD ? ? PT End of Session - 07/31/21 1041   ? ? Visit Number 11   ? Number of Visits 24   ? Date for PT Re-Evaluation 09/03/21   ? Authorization Type MEDICARE PART B reporting period from 07/29/2021   ? Progress Note Due on Visit 10   ? PT Start Time 1035   ? PT Stop Time 1113   ? PT Time Calculation (min) 38 min   ? Activity Tolerance Patient tolerated treatment well   ? Behavior During Therapy [facility] for tasks assessed/performed   ? ?  ?  ? ?  ? ? ? ? ? ? ? ?History reviewed. No pertinent past medical history. ?Past Surgical History:  ?Procedure Laterality Date  ? BREAST BIOPSY Left   ? benign   ? ?There are no problems to display for this patient. ? ? 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 ? ?THERAPY DIAG:  ?Cervicalgia ? ?Other muscle spasm ? ?Paresthesia of skin ? ?PERTINENT HISTORY: Patient is a 81 y.o. 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 This patient's chief complaints consist of neck pain and stiffness following leading to the following functional deficits: difficulty with driving, turning her head to see blind spot, baking/cooking, volunteering, walking for exercise, any activity, sleeping, fear of recurrence, missed a trip to [place]. . 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 Patient denies  hx of stroke, seizures, major cardiac events, diabetes, unexplained weight loss, changes in bowel or bladder problems, new onset stumbling or dropping things, things getting stuck in throat, lumbar surgery. Husband passed away 2 years ago ? ?PRECAUTIONS: fall risk ? ?SUBJECTIVE: Patient reports her neck is feeling well with no pain upon arrival but she has 5/10 usual pain between her right hip and low back. She was not sore after last PT session. HEP continues to go well.  ? ?PAIN:  ?Are you having pain? Yes, 5/10 between low back and right hip ? ?TODAY'S TREATMENT:  ?+RA ?+latex allergy ?  ?Therapeutic exercise: to centralize symptoms and improve ROM, strength, muscular endurance, and activity tolerance required for successful completion of functional activities.  ?- NuStep level 3 using bilateral upper and lower extremities. Seat/handle setting 11/10. For improved extremity mobility, muscular endurance, and activity tolerance; and to induce the analgesic effect of aerobic exercise, stimulate improved joint nutrition, and prepare body structures and systems for following interventions. x 5  minutes. Average SPM = 81. ?- seated rows at [facility] machine, 3x15 at 10# ?- seated lat pull, 3x15 at 20/15/15# ?- hooklying with feet elevated, chest press with plus using 4# DB in each hand, 3x10 (rates hard RPE).  ?- Sidelying open book (thoracic rotation) to improve thoracic, shoulder girdle, and upper trunk mobility. Required instruction for technique and cuing to achieve end range as tolerated, hold time, and breathing technique. 1-5 second holds. 1x10 each side.  ? ?Manual therapy: to reduce pain and tissue  tension, improve range of motion, neuromodulation, in order to promote improved ability to complete functional activities.  ?SUPINE/HOOKLYING ?- STM to posterior cervical spine musculature focusing on tender portions of cervical paraspinals, and suboccipitals.  ?- cervical manual traction, 3x 30 seconds.  ?- Cervical  spine PROM rotation with overpressure to tolerance, 1x10 each side. 10 second hold/oscillation.  ? ?Pt required multimodal cuing for proper technique and to facilitate improved neuromuscular control, strength, range of motion, and functional ability resulting in improved performance and form ? ? ?PATIENT EDUCATION: ?Education details: Exercise purpose/form. Self management techniques. ?Person educated: Patient ?Education method: Explanation, Demonstration, Tactile cues, and Verbal cues ?Education comprehension: verbalized understanding, returned demonstration, verbal cues required, tactile cues required, and needs further education ? ? ?HOME EXERCISE PROGRAM: ?Access Code: [email or web address] ?URL: [email or web address] ?Date: 07/09/2021 ?Prepared by: Rosita Kea ? ?Exercises ?Supine Deep Neck Flexor Training - Hold - 2 x daily - 2-3 sets - 10 reps - 5 seconds hold ?Seated Cervical Rotation Stretch - 2 x daily - 2 sets - 10 reps - 5 seconds hold ?Seated Upper Trapezius Stretch - 2 x daily - 3 sets - 30 seconds hold ?Cervical Retraction with Overpressure - 2 x daily - 2 sets - 10 reps - 1 second hold ?Row with band/cable - 1 x daily - 3 sets - 10 reps - 2 seconds hold ? ? ? PT Short Term Goals   ? ?  ? PT SHORT TERM GOAL #1  ? Title Be independent with initial home exercise program for self-management of symptoms.   ? Baseline Initial HEP provided at IE (06/11/2021);   ? Time 2   ? Period Weeks   ? Status Achieved   ? Target Date 06/25/21   ? ?  ?  ? ?  ? ? ? PT Long Term Goals - 07/09/21 0943   ?TARGET DATE FOR ALL LONG TERM GOALS: 09/03/2021 ?  ? PT LONG TERM GOAL #1  ? Title Be independent with a long-term home exercise program for self-management of symptoms.   ? Baseline Initial HEP provided at IE (06/11/2021); patient participating well most of the time (07/29/2021);   ? Time 12   ? Period Weeks   ? Status Partially Met   ?  ? PT LONG TERM GOAL #2  ? Title Demonstrate improved FOTO score by 10 units to  demonstrate improvement in overall condition and self-reported functional ability.   ? Baseline 47 (06/11/2021); 51 at visit #5 (07/04/2021);   ? Time 12   ? Period Weeks   ? Status Partially Met   ?  ? PT LONG TERM GOAL #3  ? Title Patient will demonstrate cervical spine AROM rotation to equal or greater than 60 degrees each way to improve ability to check blind spot while driving.   ? Baseline R 22  L 28 (06/11/2021); R 34  L 36  (07/09/2021); R 40 L 44 (07/29/2021);   ? Time 12   ? Period Weeks   ? Status Partially Met   ?  ? PT LONG TERM GOAL #4  ? Title Reduce pain to equal or less than 1/10 with functional activities to allow patient to complete valued functional tasks such as sleeping, baking/cooking, volunteering, traveling, and driving with  less difficulty.   ? Baseline up to 8/10 (06/11/2021); 5/10 (07/09/2021); 5-6/10 (07/29/2021);   ? Time 12   ? Period Weeks   ? Status Partially Met   ?  ? PT LONG TERM GOAL #5  ?  Title Complete community, work and/or recreational activities without limitation due to current condition.   ? Baseline Functional Limitations: difficulty with driving, turning her head to see blind spot, baking/cooking, volunteering, walking for exercise, any activity, sleeping, fear of recurrence, international travel (06/11/2021); everything is better but she has not decided about international travel. She wants to go to [place] (07/29/2021);   ? Time 12   ? Period Weeks   ? Status On-going   ? ?  ?  ? ?  ? ? ? Plan   ? ? Clinical Impression Statement Patient tolerated treatment well overall with no increase in pain by end of session. Continues to be tender with end range cervical spine stretching and in low back with lat pull. Continued postural and UE strengthening as well as manual therapy to continue improving ROM and strength. Plan to continue with similar interventions progressed as appropriate next session.. Patient would benefit from continued management of limiting condition by skilled  physical therapist to address remaining impairments and functional limitations to work towards stated goals and return to PLOF or maximal functional independence.  ?  ? Personal Factors and Comorbidities Comorbidity 3+

## 2021-08-05 ENCOUNTER — Ambulatory Visit: Payer: Medicare Other | Admitting: Physical Therapy

## 2021-08-05 ENCOUNTER — Encounter: Payer: Self-pay | Admitting: Physical Therapy

## 2021-08-05 DIAGNOSIS — M62838 Other muscle spasm: Secondary | ICD-10-CM

## 2021-08-05 DIAGNOSIS — R202 Paresthesia of skin: Secondary | ICD-10-CM

## 2021-08-05 DIAGNOSIS — M542 Cervicalgia: Secondary | ICD-10-CM | POA: Diagnosis not present

## 2021-08-05 NOTE — Therapy (Signed)
?OUTPATIENT PHYSICAL THERAPY TREATMENT NOTE ? ? ?Patient Name: Crystal Malone ?MRN: [id number] ?DOB:1940/09/05, 81 y.o., female ?Today's Date: 08/05/2021 ? ?PCP: Leonel Ramsay, MD ?REFERRING PROVIDER:  Girtha Hake, MD ? ? PT End of Session - 08/05/21 1528   ? ? Visit Number 12   ? Number of Visits 24   ? Date for PT Re-Evaluation 09/03/21   ? Authorization Type MEDICARE PART B reporting period from 07/29/2021   ? Progress Note Due on Visit 20   ? PT Start Time 1523   ? PT Stop Time 1601   ? PT Time Calculation (min) 38 min   ? Activity Tolerance Patient tolerated treatment well   ? Behavior During Therapy [facility] for tasks assessed/performed   ? ?  ?  ? ?  ? ? ? ? ? ? ? ? ?History reviewed. No pertinent past medical history. ?Past Surgical History:  ?Procedure Laterality Date  ? BREAST BIOPSY Left   ? benign   ? ?There are no problems to display for this patient. ? ? 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 ? ?THERAPY DIAG:  ?Cervicalgia ? ?Other muscle spasm ? ?Paresthesia of skin ? ?PERTINENT HISTORY: Patient is a 81 y.o. 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 This patient's chief complaints consist of neck pain and stiffness following leading to the following functional deficits: difficulty with driving, turning her head to see blind spot, baking/cooking, volunteering, walking for exercise, any activity, sleeping, fear of recurrence, missed a trip to [place]. . 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 Patient  denies hx of stroke, seizures, major cardiac events, diabetes, unexplained weight loss, changes in bowel or bladder problems, new onset stumbling or dropping things, things getting stuck in throat, lumbar surgery. Husband passed away 2 years ago ? ?PRECAUTIONS: fall risk ? ?SUBJECTIVE: Patient reports she is feeling okay today. She said her neck is feeling okay with no pain upon arrival. She states she pulled some weeds yesterday and her back is really bothering her now in the usual spot on the right lower back.  ? ?PAIN:  ?Are you having pain? Yes, 5-6/10 right lower back (usual place) ? ?TODAY'S TREATMENT:  ?+RA ?+latex allergy ?  ?Therapeutic exercise: to centralize symptoms and improve ROM, strength, muscular endurance, and activity tolerance required for successful completion of functional activities.  ?- NuStep level 4 using bilateral upper and lower extremities. Seat/handle setting 11/10. For improved extremity mobility, muscular endurance, and activity tolerance; and to induce the analgesic effect of aerobic exercise, stimulate improved joint nutrition, and prepare body structures and systems for following interventions. x 5  minutes. Average SPM = 69. ?- seated rows at [facility] machine, 3x15 at 10# ?- seated lat pull, 3x10-15 at 20# (increased pain at R low back).  ?- hooklying with feet elevated, chest press with plus using 5# DB in each hand, 3x15/10/10 (rates hard RPE).  ?- Sidelying open book (thoracic rotation) to improve thoracic, shoulder girdle, and upper trunk mobility. Required instruction for technique and cuing to achieve end range as tolerated, hold time, and breathing technique. 1-5 second holds.  1x10 each side.  ? ?Manual therapy: to reduce pain and tissue tension, improve range of motion, neuromodulation, in order to promote improved ability to complete functional activities.  ?SUPINE/HOOKLYING ?- STM to posterior cervical spine musculature focusing on tender portions of cervical paraspinals,  and suboccipitals, and B UT  ?- cervical manual traction, 3x 30 seconds.  ?- Cervical spine PROM rotation with overpressure to tolerance, 1x10 each side. 10 second hold/oscillation.  ? ?Pt required multimodal cuing for proper technique and to facilitate improved neuromuscular control, strength, range of motion, and functional ability resulting in improved performance and form ? ? ?PATIENT EDUCATION: ?Education details: Exercise purpose/form. Self management techniques. ?Person educated: Patient ?Education method: Explanation, Demonstration, Tactile cues, and Verbal cues ?Education comprehension: verbalized understanding, returned demonstration, verbal cues required, tactile cues required, and needs further education ? ? ?HOME EXERCISE PROGRAM: ?Access Code: [email or web address] ?URL: [email or web address] ?Date: 07/09/2021 ?Prepared by: Rosita Kea ? ?Exercises ?Supine Deep Neck Flexor Training - Hold - 2 x daily - 2-3 sets - 10 reps - 5 seconds hold ?Seated Cervical Rotation Stretch - 2 x daily - 2 sets - 10 reps - 5 seconds hold ?Seated Upper Trapezius Stretch - 2 x daily - 3 sets - 30 seconds hold ?Cervical Retraction with Overpressure - 2 x daily - 2 sets - 10 reps - 1 second hold ?Row with band/cable - 1 x daily - 3 sets - 10 reps - 2 seconds hold ? ? ? PT Short Term Goals   ? ?  ? PT SHORT TERM GOAL #1  ? Title Be independent with initial home exercise program for self-management of symptoms.   ? Baseline Initial HEP provided at IE (06/11/2021);   ? Time 2   ? Period Weeks   ? Status Achieved   ? Target Date 06/25/21   ? ?  ?  ? ?  ? ? ? PT Long Term Goals - 07/09/21 0943   ?TARGET DATE FOR ALL LONG TERM GOALS: 09/03/2021 ?  ? PT LONG TERM GOAL #1  ? Title Be independent with a long-term home exercise program for self-management of symptoms.   ? Baseline Initial HEP provided at IE (06/11/2021); patient participating well most of the time (07/29/2021);   ? Time 12   ? Period Weeks   ? Status Partially Met   ?  ?  PT LONG TERM GOAL #2  ? Title Demonstrate improved FOTO score by 10 units to demonstrate improvement in overall condition and self-reported functional ability.   ? Baseline 47 (06/11/2021); 51 at visit #5 (07/04/2021);   ? Time 12   ? Period Weeks   ? Status Partially Met   ?  ? PT LONG TERM GOAL #3  ? Title Patient will demonstrate cervical spine AROM rotation to equal or greater than 60 degrees each way to improve ability to check blind spot while driving.   ? Baseline R 22  L 28 (06/11/2021); R 34  L 36  (07/09/2021); R 40 L 44 (07/29/2021);   ? Time 12   ? Period Weeks   ? Status Partially Met   ?  ? PT LONG TERM GOAL #4  ? Title Reduce pain to equal or less than 1/10 with functional activities to allow patient to complete valued functional tasks such as sleeping, baking/cooking, volunteering, traveling, and driving with  less difficulty.   ? Baseline up to 8/10 (06/11/2021); 5/10 (07/09/2021); 5-6/10 (07/29/2021);   ? Time 12   ? Period Weeks   ?  Status Partially Met   ?  ? PT LONG TERM GOAL #5  ? Title Complete community, work and/or recreational activities without limitation due to current condition.   ? Baseline Functional Limitations: difficulty with driving, turning her head to see blind spot, baking/cooking, volunteering, walking for exercise, any activity, sleeping, fear of recurrence, international travel (06/11/2021); everything is better but she has not decided about international travel. She wants to go to [place] (07/29/2021);   ? Time 12   ? Period Weeks   ? Status On-going   ? ?  ?  ? ?  ? ? ? Plan   ? ? Clinical Impression Statement Patient tolerated treatment well overall with some difficulty due to right low back discomfort. Continued working on postural strengthening and upper spine ROM. Patient continues to have motion restrictions that limits her ability to check blinds spot while driving and view her surroundings. Plan to continue working on improved ROM and postural strength as appropriate next  session. Patient would benefit from continued management of limiting condition by skilled physical therapist to address remaining impairments and functional limitations to work towards stated goals and return to

## 2021-08-06 ENCOUNTER — Encounter: Payer: TRICARE For Life (TFL) | Admitting: Physical Therapy

## 2021-08-07 ENCOUNTER — Encounter: Payer: Self-pay | Admitting: Physical Therapy

## 2021-08-07 ENCOUNTER — Ambulatory Visit: Payer: Medicare Other | Admitting: Physical Therapy

## 2021-08-07 DIAGNOSIS — R202 Paresthesia of skin: Secondary | ICD-10-CM

## 2021-08-07 DIAGNOSIS — M62838 Other muscle spasm: Secondary | ICD-10-CM

## 2021-08-07 DIAGNOSIS — M542 Cervicalgia: Secondary | ICD-10-CM | POA: Diagnosis not present

## 2021-08-07 NOTE — Therapy (Signed)
?OUTPATIENT PHYSICAL THERAPY TREATMENT NOTE ? ? ?Patient Name: Crystal Malone ?MRN: [id number] ?DOB:06/19/1940, 81 y.o., female ?Today's Date: 08/07/2021 ? ?PCP: Leonel Ramsay, MD ?REFERRING PROVIDER:  Girtha Hake, MD ? ? PT End of Session - 08/07/21 1522   ? ? Visit Number 13   ? Number of Visits 24   ? Date for PT Re-Evaluation 09/03/21   ? Authorization Type MEDICARE PART B reporting period from 07/29/2021   ? Progress Note Due on Visit 20   ? PT Start Time 1520   ? PT Stop Time 1558   ? PT Time Calculation (min) 38 min   ? Activity Tolerance Patient tolerated treatment well   ? Behavior During Therapy [facility] for tasks assessed/performed   ? ?  ?  ? ?  ? ? ? ? ? ? ? ? ? ?History reviewed. No pertinent past medical history. ?Past Surgical History:  ?Procedure Laterality Date  ? BREAST BIOPSY Left   ? benign   ? ?There are no problems to display for this patient. ? ? 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 ? ?THERAPY DIAG:  ?Cervicalgia ? ?Other muscle spasm ? ?Paresthesia of skin ? ?PERTINENT HISTORY: Patient is a 81 y.o. 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 This patient's chief complaints consist of neck pain and stiffness following leading to the following functional deficits: difficulty with driving, turning her head to see blind spot, baking/cooking, volunteering, walking for exercise, any activity, sleeping, fear of recurrence, missed a trip to [place]. . 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 Patient  denies hx of stroke, seizures, major cardiac events, diabetes, unexplained weight loss, changes in bowel or bladder problems, new onset stumbling or dropping things, things getting stuck in throat, lumbar surgery. Husband passed away 2 years ago ? ?PRECAUTIONS: fall risk ? ?SUBJECTIVE: Patient reports she is feeling well. She has no neck pain upon arrival but her back is continuing to bother her. She states she felt okay after last PT session.  ? ?PAIN:  ?Are you having pain? Yes, 5/10 right lower back (usual place) ? ?TODAY'S TREATMENT:  ?+RA ?+latex allergy ?  ?Therapeutic exercise: to centralize symptoms and improve ROM, strength, muscular endurance, and activity tolerance required for successful completion of functional activities.  ?- NuStep level 1-4 using bilateral upper and lower extremities. Seat/handle setting 11/10. For improved extremity mobility, muscular endurance, and activity tolerance; and to induce the analgesic effect of aerobic exercise, stimulate improved joint nutrition, and prepare body structures and systems for following interventions. x 5  minutes. Average SPM = 81. ?(Manual therapy - see below) ?- hooklying with feet elevated, chest press with plus using 5# DB in each hand, 3x15 (rates hard RPE). ?- seated rows at [facility] machine, 3x10 at 15# ?- seated lat pull, 3x15/15/10 at 20# ?  (Repeated manual stretch -see below) ? ?Manual therapy: to reduce pain and tissue tension, improve range of motion, neuromodulation, in order to promote improved ability to complete functional activities.  ?SUPINE/HOOKLYING ?- STM to posterior cervical spine musculature focusing on tender portions of cervical paraspinals, and suboccipitals,  ?-  cervical manual traction, 3x 30 seconds.  ?- Cervical spine PROM rotation with overpressure to tolerance, 2x10 each side. 10 second hold/oscillation.  ? ?Pt required multimodal cuing for proper technique and to facilitate improved neuromuscular control, strength, range of  motion, and functional ability resulting in improved performance and form ? ? ?PATIENT EDUCATION: ?Education details: Exercise purpose/form. Self management techniques. ?Person educated: Patient ?Education method: Explanation, Demonstration, Tactile cues, and Verbal cues ?Education comprehension: verbalized understanding, returned demonstration, verbal cues required, tactile cues required, and needs further education ? ? ?HOME EXERCISE PROGRAM: ?Access Code: [email or web address] ?URL: [email or web address] ?Date: 07/09/2021 ?Prepared by: Rosita Kea ? ?Exercises ?Supine Deep Neck Flexor Training - Hold - 2 x daily - 2-3 sets - 10 reps - 5 seconds hold ?Seated Cervical Rotation Stretch - 2 x daily - 2 sets - 10 reps - 5 seconds hold ?Seated Upper Trapezius Stretch - 2 x daily - 3 sets - 30 seconds hold ?Cervical Retraction with Overpressure - 2 x daily - 2 sets - 10 reps - 1 second hold ?Row with band/cable - 1 x daily - 3 sets - 10 reps - 2 seconds hold ? ? ? PT Short Term Goals   ? ?  ? PT SHORT TERM GOAL #1  ? Title Be independent with initial home exercise program for self-management of symptoms.   ? Baseline Initial HEP provided at IE (06/11/2021);   ? Time 2   ? Period Weeks   ? Status Achieved   ? Target Date 06/25/21   ? ?  ?  ? ?  ? ? ? PT Long Term Goals - 07/09/21 0943   ?TARGET DATE FOR ALL LONG TERM GOALS: 09/03/2021 ?  ? PT LONG TERM GOAL #1  ? Title Be independent with a long-term home exercise program for self-management of symptoms.   ? Baseline Initial HEP provided at IE (06/11/2021); patient participating well most of the time (07/29/2021);   ? Time 12   ? Period Weeks   ? Status Partially Met   ?  ? PT LONG TERM GOAL #2  ? Title Demonstrate improved FOTO score by 10 units to demonstrate improvement in overall condition and self-reported functional ability.   ? Baseline 47 (06/11/2021); 51 at visit #5 (07/04/2021);   ? Time 12   ? Period Weeks   ? Status Partially Met   ?  ? PT LONG TERM GOAL #3  ?  Title Patient will demonstrate cervical spine AROM rotation to equal or greater than 60 degrees each way to improve ability to check blind spot while driving.   ? Baseline R 22  L 28 (06/11/2021); R 34  L 36  (07/09/2021); R 40 L 44 (07/29/2021);   ? Time 12   ? Period Weeks   ? Status Partially Met   ?  ? PT LONG TERM GOAL #4  ? Title Reduce pain to equal or less than 1/10 with functional activities to allow patient to complete valued functional tasks such as sleeping, baking/cooking, volunteering, traveling, and driving with  less difficulty.   ? Baseline up to 8/10 (06/11/2021); 5/10 (07/09/2021); 5-6/10 (07/29/2021);   ? Time 12   ? Period Weeks   ? Status Partially Met   ?  ? PT LONG TERM GOAL #5  ? Title Complete community, work and/or recreational activities without limitation due to current condition.   ? Baseline Functional Limitations: difficulty with driving, turning her head to see blind spot, baking/cooking, volunteering, walking for exercise, any  activity, sleeping, fear of recurrence, international travel (06/11/2021); everything is better but she has not decided about international travel. She wants to go to [place] (07/29/2021);   ? Time 12   ? Period Weeks   ? Status On-going   ? ?  ?  ? ?  ? ? ? Plan   ? ? Clinical Impression Statement Patient tolerated treatment well overall and continued to advance postural strengthening as needed and undergo manual therapy for improved neck ROM. Plan to continue with progressions as tolerated next session. Patient would benefit from continued management of limiting condition by skilled physical therapist to address remaining impairments and functional limitations to work towards stated goals and return to PLOF or maximal functional independence.   ? Personal Factors and Comorbidities Comorbidity 3+;Past/Current Experience;Fitness;Time since onset of injury/illness/exacerbation;Age   ? 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

## 2021-08-13 ENCOUNTER — Encounter: Payer: Medicare Other | Admitting: Physical Therapy

## 2021-08-13 ENCOUNTER — Encounter: Payer: Self-pay | Admitting: Physical Therapy

## 2021-08-13 ENCOUNTER — Ambulatory Visit (INDEPENDENT_AMBULATORY_CARE_PROVIDER_SITE_OTHER): Payer: Medicare Other | Admitting: Podiatry

## 2021-08-13 ENCOUNTER — Ambulatory Visit: Payer: Medicare Other | Admitting: Physical Therapy

## 2021-08-13 DIAGNOSIS — M2042 Other hammer toe(s) (acquired), left foot: Secondary | ICD-10-CM | POA: Diagnosis not present

## 2021-08-13 DIAGNOSIS — M542 Cervicalgia: Secondary | ICD-10-CM | POA: Diagnosis not present

## 2021-08-13 DIAGNOSIS — L989 Disorder of the skin and subcutaneous tissue, unspecified: Secondary | ICD-10-CM | POA: Diagnosis not present

## 2021-08-13 DIAGNOSIS — R202 Paresthesia of skin: Secondary | ICD-10-CM

## 2021-08-13 DIAGNOSIS — M62838 Other muscle spasm: Secondary | ICD-10-CM

## 2021-08-13 NOTE — Progress Notes (Signed)
? ?  HPI: 81 y.o. female presenting today for evaluation of a symptomatic callus to the medial aspect of the right second toe as well as to address the hammertoe deformity to the left second digit.  She is looking for conservative treatment options.  She presents for further treatment and evaluation ? ?No past medical history on file. ? ?Past Surgical History:  ?Procedure Laterality Date  ? BREAST BIOPSY Left   ? benign   ? ? ?Allergies  ?Allergen Reactions  ? Latex Rash  ? Nsaids Other (See Comments)  ?  Bleeding ulcer ?Bleeding ulcer ?  ? ?  ?Physical Exam: ?General: The patient is alert and oriented x3 in no acute distress. ? ?Dermatology: Skin is warm, dry and supple bilateral lower extremities. Negative for open lesions or macerations.  Hyperkeratotic callus tissue noted to the medial aspect of the right second digit adjacent to the hallux ? ?Vascular: Palpable pedal pulses bilaterally. Capillary refill within normal limits.  Negative for any significant edema or erythema ? ?Neurological: Light touch and protective threshold grossly intact ? ?Musculoskeletal Exam: Symptomatic hammertoe noted to the second digit left foot.  Hallux valgus also noted to the right foot.  Patient has past surgical history of bunion surgery left and hammertoe repair second right. ? ?Assessment: ?1.  Symptomatic callus medial aspect of the right second digit ?2.  Hammertoe deformity second digit left ?3. PSxHx RT 2nd HT surgery and LT bunion sx several years prior ? ? ?Plan of Care:  ?1. Patient evaluated.  Today we did discuss the possibility of surgery however I do recommend conservative treatment.  The patient agrees. ?2.  Excisional debridement of the hyperkeratotic callus was performed to the right second digit.  Patient felt immediate relief. ?3.  Silicone toe spacer was provided to alleviate pressure from the great toe and second digit of the right foot.  Silicone toe sleeve was also dispensed to alleviate pressure from the  hammertoe deformity ?4.  Continue wearing good supportive shoes that do not constrict the toebox area ?5.  Return to clinic as needed ?  ?  ?Edrick Kins, DPM ?[place] ? ?Dr. Edrick Kins, DPM  ?  ?[address]                                        ?[place], [place] [address]                ?Office [phone]  ?Fax [phone] ? ? ? ? ?

## 2021-08-13 NOTE — Therapy (Signed)
?OUTPATIENT PHYSICAL THERAPY TREATMENT NOTE / DISCHARGE SUMMARY ?Dates of reporting: 06/11/2021 to 08/13/2021 ? ? ?Patient Name: Crystal Malone ?MRN: [id number] ?DOB:[birth year], 81 y.o., female ?Today's Date: 08/13/2021 ? ?PCP: Leonel Ramsay, MD ?REFERRING PROVIDER:  Girtha Hake, MD ? ? PT End of Session - 08/13/21 1117   ? ? Visit Number 14   ? Number of Visits 24   ? Date for PT Re-Evaluation 09/03/21   ? Authorization Type MEDICARE PART B reporting period from 07/29/2021   ? Progress Note Due on Visit 20   ? PT Start Time 1033   ? PT Stop Time 1113   ? PT Time Calculation (min) 40 min   ? Activity Tolerance Patient tolerated treatment well   ? Behavior During Therapy [facility] for tasks assessed/performed   ? ?  ?  ? ?  ? ? ? ? ? ? ? ? ? ? ?History reviewed. No pertinent past medical history. ?Past Surgical History:  ?Procedure Laterality Date  ? BREAST BIOPSY Left   ? benign   ? ?There are no problems to display for this patient. ? ? 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 ? ?THERAPY DIAG:  ?Cervicalgia ? ?Other muscle spasm ? ?Paresthesia of skin ? ?PERTINENT HISTORY: Patient is a 81 y.o. 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 This patient's chief complaints consist of neck pain and stiffness following leading to the following functional deficits: difficulty with driving, turning her head to see blind spot, baking/cooking, volunteering, walking for exercise, any activity, sleeping, fear of recurrence, missed a trip to [place]. . 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 Patient denies hx of stroke, seizures, major cardiac events, diabetes, unexplained weight loss, changes in bowel or bladder problems, new onset stumbling or dropping things, things getting stuck in throat, lumbar surgery. Husband passed away 2 years ago ? ?PRECAUTIONS: fall risk ? ?SUBJECTIVE: Patient reports she is feeling well and her neck is feeling good. She states she feels ready to discharge to long term HEP today.  ?She would like to know what settings she has been using on the strength training equipment are so she can try to replicate it at the gym.  ? ?PAIN:  ?Are you having pain? Yes, 4/10 right lower back (usual place) ? ?TODAY'S TREATMENT:  ?+RA ?+latex allergy ?  ?Therapeutic exercise: to centralize symptoms and improve ROM, strength, muscular endurance, and activity tolerance required for successful completion of functional activities.  ?- NuStep level 4 using bilateral upper and lower extremities. Seat/handle setting 11/10. For improved extremity mobility, muscular endurance, and activity tolerance; and to induce the analgesic effect of aerobic exercise, stimulate improved joint nutrition, and prepare body structures and systems for following interventions. x 5  minutes. Average SPM = 87 ?- seated rows at OMEGA machine, 3x10 at 15# ?- seated lat pull, 3x15 at 15# ?- hooklying with feet elevated, chest press with plus using 5# DB in each hand, 3x15 (rates hard RPE). ? ?Manual therapy: to reduce pain and tissue tension, improve range of motion, neuromodulation, in order to promote improved ability to complete functional  activities.  ?SUPINE/HOOKLYING ?- STM to posterior cervical spine musculature focusing on tender portions of cervical paraspinals, and suboccipitals,  ?- cervical manual traction, 3x 30 seconds.  ?- Cervical spine PROM rotation with overpressure to tolerance, 2x10 each side. 10 second hold/oscillation.  ? ?Pt required  multimodal cuing for proper technique and to facilitate improved neuromuscular control, strength, range of motion, and functional ability resulting in improved performance and form ? ? ?PATIENT EDUCATION: ?Education details: Exercise purpose/form. Self management techniques. ?Person educated: Patient ?Education method: Explanation, Demonstration, Tactile cues, and Verbal cues ?Education comprehension: verbalized understanding, returned demonstration, verbal cues required, tactile cues required, and needs further education ? ? ?HOME EXERCISE PROGRAM: ?Access Code: [email or web address] ?URL: [email or web address] ?Date: 08/13/2021 ?Prepared by: Rosita Kea ? ?Exercises ?- Supine Deep Neck Flexor Training - Hold  - 2 x daily - 2-3 sets - 10 reps - 5 seconds hold ?- Seated Cervical Rotation Stretch  - 2 x daily - 2 sets - 10 reps - 5 seconds hold ?- Seated Upper Trapezius Stretch  - 2 x daily - 3 sets - 30 seconds hold ?- Cervical Retraction with Overpressure  - 2 x daily - 2 sets - 10 reps - 1 second hold ?- Row with band/cable  - 1 x daily - 3 sets - 10 reps - 2 seconds hold ?- Supine Chest Press with Dumbbells  - 3 x weekly - 3 sets - 15 reps ?- Lat Pull Down - Cable/Bar  - 3 x weekly - 3 sets - 15 reps - 1 second hold ?HOME EXERCISE PROGRAM [[id number]] ? ?Gym [employer] -  Repeat 15 Times, Complete 3 Sets, Perform 3 Times a Week ? ? ? PT Short Term Goals   ? ?  ? PT SHORT TERM GOAL #1  ? Title Be independent with initial home exercise program for self-management of symptoms.   ? Baseline Initial HEP provided at IE (06/11/2021);   ? Time 2   ? Period Weeks   ? Status Achieved   ? Target Date 06/25/21   ? ?  ?  ? ?  ? ? ? PT Long Term Goals   ?TARGET DATE FOR ALL LONG TERM GOALS: 09/03/2021 ?  ? PT LONG TERM GOAL #1  ? Title Be independent with a long-term home exercise program for self-management of symptoms.   ? Baseline Initial HEP provided at IE (06/11/2021); patient participating well most of the time (07/29/2021);    ? Time 12   ? Period Weeks   ? Status Achieved 08/13/2021  ?  ? PT LONG TERM GOAL #2  ? Title Demonstrate improved FOTO score by 10 units to demonstrate improvement in overall condition and self-reported functional ability.   ? Baseline 47 (06/11/2021); 51 at visit #5 (07/04/2021); 61 at visit # 14 (08/13/2021);   ? Time 12   ? Period Weeks   ? Status Achieved 08/13/2021  ?  ? PT LONG TERM GOAL #3  ? Title Patient will demonstrate cervical spine AROM rotation to equal or greater than 60 degrees each way to improve ability to check blind spot while driving.   ? Baseline R 22  L 28 (06/11/2021); R 34  L 36  (07/09/2021); R 40 L 44 (07/29/2021);   ? Time 12   ? Period Weeks   ? Status Partially Met   ?  ? PT LONG TERM GOAL #4  ? Title Reduce pain to equal or less than 1/10 with functional activities to allow patient to  complete valued functional tasks such as sleeping, baking/cooking, volunteering, traveling, and driving with  less difficulty.   ? Baseline up to 8/10 (06/11/2021); 5/10 (07/09/2021); 5-6/10 (07/29/2021); up to 4/10 in the last two weeks but it has not hurt when she is "being normal" (08/13/2021);   ? Time 12   ? Period Weeks   ? Status Partially Met   ?  ? PT LONG TERM GOAL #5  ? Title Complete community, work and/or recreational activities without limitation due to current condition.   ? Baseline Functional Limitations: difficulty with driving, turning her head to see blind spot, baking/cooking, volunteering, walking for exercise, any activity, sleeping, fear of recurrence, international travel (06/11/2021); everything is better but she has not decided about international travel. She wants to go to [place] (07/29/2021); reports her neck does not bother her in usual activities but does continue to have difficulty checking blind spot (08/13/2021);   ? Time 12   ? Period Weeks   ? Status Partially met  ? ?  ?  ? ?  ? ? ? Plan   ? ? Clinical Impression Statement Patient has attended 14 physical therapy sessions since  starting current episode of care on  06/11/2021. She has made good progress towards her goals and has met her HEP and FOTO (self-reported function). She feels satisfied with her improvement and feels confident dischar

## 2021-08-14 ENCOUNTER — Encounter: Payer: Medicare Other | Admitting: Physical Therapy

## 2021-08-19 ENCOUNTER — Encounter: Payer: TRICARE For Life (TFL) | Admitting: Physical Therapy

## 2021-08-20 ENCOUNTER — Ambulatory Visit
Admission: RE | Admit: 2021-08-20 | Discharge: 2021-08-20 | Disposition: A | Discharge status: Home / Self Care | Payer: Medicare Other | Source: Ambulatory Visit | Attending: Infectious Diseases | Admitting: Infectious Diseases

## 2021-08-20 DIAGNOSIS — Z1231 Encounter for screening mammogram for malignant neoplasm of breast: Secondary | ICD-10-CM | POA: Insufficient documentation

## 2021-08-21 ENCOUNTER — Encounter: Payer: TRICARE For Life (TFL) | Admitting: Physical Therapy

## 2021-11-15 ENCOUNTER — Ambulatory Visit: Payer: Medicare Other | Admitting: Obstetrics and Gynecology

## 2022-01-11 ENCOUNTER — Emergency Department
Admission: EM | Admit: 2022-01-11 | Discharge: 2022-01-11 | Disposition: A | Discharge status: Home / Self Care | Payer: Medicare Other | Attending: Emergency Medicine | Admitting: Emergency Medicine

## 2022-01-11 ENCOUNTER — Encounter: Payer: Self-pay | Admitting: Emergency Medicine

## 2022-01-11 ENCOUNTER — Other Ambulatory Visit: Payer: Self-pay

## 2022-01-11 ENCOUNTER — Emergency Department: Payer: Medicare Other

## 2022-01-11 DIAGNOSIS — R21 Rash and other nonspecific skin eruption: Secondary | ICD-10-CM | POA: Insufficient documentation

## 2022-01-11 DIAGNOSIS — W232XXA Caught, crushed, jammed or pinched between a moving and stationary object, initial encounter: Secondary | ICD-10-CM | POA: Insufficient documentation

## 2022-01-11 DIAGNOSIS — M79605 Pain in left leg: Secondary | ICD-10-CM

## 2022-01-11 MED ORDER — CEPHALEXIN 500 MG PO CAPS: 1000.0000 mg | ORAL_CAPSULE | Freq: Two times a day (BID) | ORAL | 0 refills | Status: AC

## 2022-01-11 NOTE — ED Provider Notes (Signed)
[facility] Provider Note    Event Date/Time   First MD Initiated Contact with Patient 01/11/22 2116     (approximate)   History   Chief Complaint Leg Pain   HPI Crystal Malone is a 81 y.o. female, history of DVT, depression, GERD, rheumatoid arthritis, presents to the emergency department for evaluation of left lower extremity pain.  She states that her leg got caught in the bottom part of the car door today.  She states that since then, she has had increased warmth and redness on the affected area.  She states that she is still able to ambulate well on her own and does not believe that the bones are broken.  She does express some concern for blood clots, as she has had this occur to her in the past.  Denies fever/chills, chest pain, shortness of breath, cold sensation in the affected extremity, numb/tingling the affected extremity, abdominal pain, flank pain, nausea/vomiting, diarrhea, or rash/lesions.  History Limitations: No limitations.        Physical Exam  Triage Vital Signs: ED Triage Vitals  Enc Vitals Group     BP 01/11/22 2008 138/76     Pulse Rate 01/11/22 2008 81     Resp 01/11/22 2008 16     Temp 01/11/22 2008 98.1 F (36.7 C)     Temp Source 01/11/22 2008 Oral     SpO2 01/11/22 2008 95 %     Weight 01/11/22 2009 220 lb (99.8 kg)     Height 01/11/22 2009 5\' 6"  (1.676 m)     Head Circumference --      Peak Flow --      Pain Score 01/11/22 2009 3     Pain Loc --      Pain Edu? --      Excl. in [place]? --     Most recent vital signs: Vitals:   01/11/22 2008  BP: 138/76  Pulse: 81  Resp: 16  Temp: 98.1 F (36.7 C)  SpO2: 95%    General: Awake, NAD.  Skin: Warm, dry. No rashes or lesions.  Eyes: PERRL. Conjunctivae normal.  CV: Good peripheral perfusion.  Resp: Normal effort.  Abd: Soft, non-tender. No distention.  Neuro: At baseline. No gross neurological deficits.  Musculoskeletal: Normal ROM of all extremities.  Focused  Exam: Erythematous rash approximately 5 cm in diameter on the posterior aspect of the left lower extremity along the Achilles.  Warm and tender to the touch.  No underlying bony tenderness.  She is able to ambulate well on her own without assistance.  PMS intact distally.  Negative Homans' sign.  Physical Exam    ED Results / Procedures / Treatments  Labs (all labs ordered are listed, but only abnormal results are displayed) Labs Reviewed - No data to display   EKG N/A.    RADIOLOGY  ED Provider Interpretation: I personally viewed and interpreted this ultrasound, no evidence of DVT.  US Venous Img Lower Unilateral Left  Result Date: 01/11/2022 CLINICAL DATA:  Pain, redness left lower extremity EXAM: LEFT LOWER EXTREMITY VENOUS DOPPLER ULTRASOUND TECHNIQUE: Gray-scale sonography with compression, as well as color and duplex ultrasound, were performed to evaluate the deep venous system(s) from the level of the common femoral vein through the popliteal and proximal calf veins. COMPARISON:  None Available. FINDINGS: VENOUS Normal compressibility of the common femoral, superficial femoral, and popliteal veins, as well as the visualized calf veins. Visualized portions of profunda femoral vein and  great saphenous vein unremarkable. No filling defects to suggest DVT on grayscale or color Doppler imaging. Doppler waveforms show normal direction of venous flow, normal respiratory plasticity and response to augmentation. Limited views of the contralateral common femoral vein are unremarkable. OTHER None. Limitations: none IMPRESSION: Negative. Electronically Signed   By: Rolm Baptise M.D.   On: 01/11/2022 21:03    PROCEDURES:  Critical Care performed: N/A.  Procedures    MEDICATIONS ORDERED IN ED: Medications - No data to display   IMPRESSION / MDM / [place] / ED COURSE  I reviewed the triage vital signs and the nursing notes.                              Differential  diagnosis includes, but is not limited to, DVT, cellulitis, gastrocnemius strain, Achilles strain, contact dermatitis.    Assessment/Plan Patient presents with pain in the left lower extremity after getting it caught between her car/car door.  She appears to be ambulating well on it.  Very low suspicion for any underlying osseous or musculoskeletal injury.  Ultrasound shows no DVT.  I suspect that this is likely a cellulitic rash developing.  She otherwise appears systemically well.  We will provide her with a prescription for cephalexin and have her follow-up with her primary care provider within the next few days to ensure some improvement in her symptoms.  Patient is amenable to this plan.  Will discharge  Provided the patient with anticipatory guidance, return precautions, and educational material. Encouraged the patient to return to the emergency department at any time if they begin to experience any new or worsening symptoms. Patient expressed understanding and agreed with the plan.   Patient's presentation is most consistent with acute complicated illness / injury requiring diagnostic workup.       FINAL CLINICAL IMPRESSION(S) / ED DIAGNOSES   Final diagnoses:  Left leg pain     Rx / DC Orders   ED Discharge Orders          Ordered    cephALEXin (KEFLEX) 500 MG capsule  2 times daily        01/11/22 2222             Note:  This document was prepared using Dragon voice recognition software and may include unintentional dictation errors.   Teodoro Spray, [place] 01/11/22 2228    Delman Kitten, MD 01/12/22 [id number]

## 2022-01-11 NOTE — Discharge Instructions (Signed)
-  Take the full course of the antibiotics as prescribed.  -Please see your regular doctor within the next 3 to 4 days for recheck of the rash.  -Return to the emergency department anytime if you begin to experience any new or worsening symptoms.

## 2022-01-11 NOTE — ED Triage Notes (Signed)
Pt presents to triage ambulatory with steady gait. Pt report she had a collision between her leg and the bottom part of the car door. Pt has redness to left lower leg, reports discomfort. History of blood clots in the past

## 2022-02-26 ENCOUNTER — Ambulatory Visit (INDEPENDENT_AMBULATORY_CARE_PROVIDER_SITE_OTHER): Payer: Medicare Other | Admitting: Obstetrics and Gynecology

## 2022-02-26 ENCOUNTER — Encounter: Payer: Self-pay | Admitting: Obstetrics and Gynecology

## 2022-02-26 VITALS — BP 128/84 | HR 68 | Ht 64.75 in | Wt 225.0 lb

## 2022-02-26 DIAGNOSIS — N3281 Overactive bladder: Secondary | ICD-10-CM

## 2022-02-26 DIAGNOSIS — N393 Stress incontinence (female) (male): Secondary | ICD-10-CM

## 2022-02-26 DIAGNOSIS — N8110 Cystocele, unspecified: Secondary | ICD-10-CM

## 2022-02-26 DIAGNOSIS — R350 Frequency of micturition: Secondary | ICD-10-CM | POA: Diagnosis not present

## 2022-02-26 DIAGNOSIS — K5904 Chronic idiopathic constipation: Secondary | ICD-10-CM

## 2022-02-26 LAB — POCT URINALYSIS DIPSTICK
Bilirubin, UA: NEGATIVE
Blood, UA: NEGATIVE
Glucose, UA: NEGATIVE
Ketones, UA: NEGATIVE
Leukocytes, UA: NEGATIVE
Nitrite, UA: NEGATIVE
Protein, UA: NEGATIVE
Spec Grav, UA: >=1.03 — AB (ref 1.010–1.025)
Urobilinogen, UA: 0.2 E.U./dL
pH, UA: 5 (ref 5.0–8.0)

## 2022-02-26 NOTE — Patient Instructions (Addendum)
Constipation: Our goal is to achieve formed bowel movements daily or every-other-day.  You may need to try different combinations of the following options to find what works best for you - everybody's body works differently so feel free to adjust the dosages as needed.  Some options to help maintain bowel health include:  Dietary changes (more leafy greens, vegetables and fruits; less processed foods) Fiber supplementation (Benefiber, FiberCon, Metamucil or Psyllium). Start slow and increase gradually to full dose. Over-the-counter agents such as: stool softeners (Docusate or Colace) and/or laxatives (Miralax, milk of magnesia)  "Power Pudding" is a natural mixture that may help your constipation.  To make blend 1 cup applesauce, 1 cup wheat bran, and 3/4 cup prune juice, refrigerate and then take 1 tablespoon daily with a large glass of water as needed.   Women should try to eat at least 21 to 25 grams of fiber a day, while men should aim for 30 to 38 grams a day. You can add fiber to your diet with food or a fiber supplement such as psyllium (metamucil), benefiber, or fibercon.   Here's a look at how much dietary fiber is found in some common foods. When buying packaged foods, check the Nutrition Facts label for fiber content. It can vary among brands.  Fruits Serving size Total fiber (grams)*  Raspberries 1 cup 8.0  Pear 1 medium 5.5  Apple, with skin 1 medium 4.5  Banana 1 medium 3.0  Orange 1 medium 3.0  Strawberries 1 cup 3.0   Vegetables Serving size Total fiber (grams)*  Green peas, boiled 1 cup 9.0  Broccoli, boiled 1 cup chopped 5.0  Turnip greens, boiled 1 cup 5.0  Brussels sprouts, boiled 1 cup 4.0  Potato, with skin, baked 1 medium 4.0  Sweet corn, boiled 1 cup 3.5  Cauliflower, raw 1 cup chopped 2.0  Carrot, raw 1 medium 1.5   Grains Serving size Total fiber (grams)*  Spaghetti, whole-wheat, cooked 1 cup 6.0  Barley, pearled, cooked 1 cup 6.0  Bran flakes 3/4 cup 5.5   Quinoa, cooked 1 cup 5.0  Oat bran muffin 1 medium 5.0  Oatmeal, instant, cooked 1 cup 5.0  Popcorn, air-popped 3 cups 3.5  Brown rice, cooked 1 cup 3.5  Bread, whole-wheat 1 slice 2.0  Bread, rye 1 slice 2.0   Legumes, nuts and seeds Serving size Total fiber (grams)*  Split peas, boiled 1 cup 16.0  Lentils, boiled 1 cup 15.5  Black beans, boiled 1 cup 15.0  Baked beans, canned 1 cup 10.0  Chia seeds 1 ounce 10.0  Almonds 1 ounce (23 nuts) 3.5  Pistachios 1 ounce (49 nuts) 3.0  Sunflower kernels 1 ounce 3.0  *Rounded to nearest 0.5 gram. Source: [employer] for [employer], Legacy Release    For treatment of stress urinary incontinence, which is leakage with physical activity/movement/strainging/coughing, we discussed expectant management versus nonsurgical options versus surgery. Nonsurgical options include weight loss, physical therapy, as well as a pessary.  Surgical options include a midurethral sling, which is a synthetic mesh sling that acts like a hammock under the urethra to prevent leakage of urine,and transurethral injection of a bulking agent.  We discussed the symptoms of overactive bladder (OAB), which include urinary urgency, urinary frequency, night-time urination, with or without urge incontinence.  We discussed management including behavioral therapy (decreasing bladder irritants by following a bladder diet, urge suppression strategies, timed voids, bladder retraining), physical therapy, medication; and for refractory cases posterior tibial nerve stimulation,  sacral neuromodulation, and intravesical botulinum toxin injection.

## 2022-02-26 NOTE — Progress Notes (Signed)
[facility] New Patient Evaluation and Consultation  Referring Provider: Marcelle Overlie* PCP: Leonel Ramsay, MD Date of Service: 02/26/2022  SUBJECTIVE Chief Complaint: New Patient (Initial Visit) Crystal Malone is a 81 y.o. female here for a consult for prolapse and urinary and fecal incontinence.)  History of Present Illness: Crystal Malone is a 81 y.o. White or Caucasian female seen in consultation at the request of Dr. Rod Mae for evaluation of prolapse.    Review of records significant for: Has a pessary in place for her prolapse- #4 ring with knob. Also using myrbetriq 50mg  for OAB. She was considering surgery.   Urodynamic Impression 06/13/20:  1. Uroflowmetry: interrupted flow pattern. Voided 158mL with PVR 70mL.  2. Cystometrogram:  Sensation increased; capacity normal at 405 mL.  Stress incontinence was demonstrated at ISD-range pressures. Stress testing performed with barrier.  Lowest positive CLPP was 59 cmH20 at 339ml.  Lowest positive VLPP was 49 cmH20 at 335ml.  Urethral pressure profile: MUCP 52cmH2O.  Detrusor overactivity was demonstrated with urge and leakage DO starting at 177mL with maxPdet 20cmH2O.  Of note, the patient was taking medication for OAB when this study was performed.  Normal compliance. 3. Micturition: Interrupted pattern with a minimal detrusor contraction and with abdominal straining near end of urination. Voided 370 mL with PVR 35 mL. Qmax 33 ml/sec, Pdet@Qmax  11 cmH20, with concern for obstructive voiding. 4. Electromyography: pelvic floor did relax during voiding.   Urinary Symptoms: Leaks urine with cough/ sneeze, laughing, lifting, going from sitting to standing, and without sensation Unsure how often she leaks- can happen anytime, does not always feel when it is happening.  Pad use: 3-4 pads per day.   She is bothered by her UI symptoms. She is on Myrbetriq 50mg . She feels it does help some.  Thinks  maybe the pessary helps a bit with the leakeage.  Has also done pelvic PT.   Day time voids 4.  Nocturia: 1-2 times per night to void. Voiding dysfunction: she empties her bladder well.  does not use a catheter to empty bladder.  When urinating, she feels dribbling after finishing Drinks: 1 cup coffee in AM, otherwise water per day. She did quit caffeine temporarily and it did not make a difference.   UTIs: 1 UTI's in the last year.   Denies history of blood in urine and kidney or bladder stones  Pelvic Organ Prolapse Symptoms:                  She Admits to a feeling of a bulge the vaginal area.  She Admits to seeing a bulge.  This bulge is bothersome. Has a pessary currently- has been using it for about 3 years and it works well overall. Has it cleaned about every 3 months. She is overall happy with the pessary.   Bowel Symptom: Bowel movements: every few days, other times can be every day Stool consistency: hard or soft  Straining: no.  Splinting: no.  Incomplete evacuation: yes.  She Admits to accidental bowel leakage / fecal incontinence  Occurs: not often  Consistency with leakage: soft  Bowel regimen: none Last colonoscopy: Date 2019, Results negative  Sexual Function Sexually active: no.    Pelvic Pain Denies pelvic pain   Past Medical History:  Past Medical History:  Diagnosis Date   Arthritis    Back pain    Lung cancer ([place])    Rheumatoid arthritis ([place])      Past Surgical History:  Past Surgical History:  Procedure Laterality Date   BREAST BIOPSY Left    benign    LUNG REMOVAL, PARTIAL Right    REPLACEMENT TOTAL KNEE BILATERAL     SPINAL FUSION     C5-7     Past OB/GYN History: OB History  Gravida Para Term Preterm AB Living  5 1 1   2 3   SAB IAB Ectopic Multiple Live Births  2       2    # Outcome Date GA Lbr Len/2nd Weight Sex Delivery Anes PTL Lv  5 Gravida           4 Gravida           3 Term      Vag-Forceps     2 SAB            1 SAB             Vaginal deliveries: 2,  Forceps/ Vacuum deliveries: 1, Cesarean section: 0 Menopausal: Denies vaginal bleeding since menopause Any history of abnormal pap smears: no.   Medications: She has a current medication list which includes the following prescription(s): vitamin d3, fluoxetine, folic acid, and mirabegron er, and the following Facility-Administered Medications: betamethasone acetate-betamethasone sodium phosphate and betamethasone acetate-betamethasone sodium phosphate.   Allergies: Patient is allergic to latex and nsaids.   Social History:  Social History   Tobacco Use   Smoking status: Former    Packs/day: 1.00    Types: Cigarettes    Start date: 04/21/1960    Quit date: 04/21/1994    Years since quitting: 27.8   Smokeless tobacco: Never  Vaping Use   Vaping Use: Never used  Substance Use Topics   Alcohol use: Yes    Alcohol/week: 7.0 standard drinks of alcohol    Types: 7 Glasses of wine per week   Drug use: Never    Relationship status: widowed She lives alone She is not employed. Regular exercise: Yes: walking, chair yoga History of abuse: No  Family History:   Family History  Problem Relation Age of Onset   Heart disease Mother    Diabetes Mother    Dementia Mother    Cancer Father    Stroke Father    Breast cancer Neg Hx      Review of Systems: Review of Systems  Constitutional:  Positive for malaise/fatigue. Negative for fever and weight loss.  Respiratory:  Negative for cough, shortness of breath and wheezing.   Cardiovascular:  Negative for chest pain, palpitations and leg swelling.  Gastrointestinal:  Negative for abdominal pain and blood in stool.  Genitourinary:  Negative for dysuria.  Musculoskeletal:  Negative for myalgias.  Skin:  Negative for rash.  Neurological:  Negative for dizziness and headaches.  Endo/Heme/Allergies:  Bruises/bleeds easily.  Psychiatric/Behavioral:  Positive for depression. The patient is not  nervous/anxious.      OBJECTIVE Physical Exam: Vitals:   02/26/22 0954  BP: 128/84  Pulse: 68  Weight: 225 lb (102.1 kg)  Height: 5' 4.75" (1.645 m)    Physical Exam Constitutional:      General: She is not in acute distress. Pulmonary:     Effort: Pulmonary effort is normal.  Abdominal:     General: There is no distension.     Palpations: Abdomen is soft.     Tenderness: There is no abdominal tenderness. There is no rebound.  Musculoskeletal:        General: No swelling. Normal range of motion.  Skin:  General: Skin is warm and dry.     Findings: No rash.  Neurological:     Mental Status: She is alert and oriented to person, place, and time.  Psychiatric:        Mood and Affect: Mood normal.        Behavior: Behavior normal.      GU / Detailed Urogynecologic Evaluation:  Pelvic Exam: Normal external female genitalia; Bartholin's and Skene's glands normal in appearance; urethral meatus normal in appearance, no urethral masses or discharge.   Pessary removed and cleaned and replaced at the end of the exam.  CST: positive  Speculum exam reveals normal vaginal mucosa with atrophy. Cervix normal appearance. Uterus normal single, nontender. Adnexa no mass, fullness, tenderness.    Pelvic floor strength II/V  Pelvic floor musculature: Right levator non-tender, Right obturator non-tender, Left levator non-tender, Left obturator non-tender  POP-Q:   POP-Q  -1                                            Aa   -1                                           Ba  -7                                              C   3.5                                            Gh  4                                            Pb  9                                            tvl   -3                                            Ap  -3                                            Bp  -9                                              D      Rectal Exam:  Normal external  rectum  Post-Void Residual (PVR) by Bladder Scan: In order to evaluate bladder emptying,  we discussed obtaining a postvoid residual and she agreed to this procedure.  Procedure: The ultrasound unit was placed on the patient's abdomen in the suprapubic region after the patient had voided. A PVR of 71 ml was obtained by bladder scan.  Laboratory Results: POC urine: negative   ASSESSMENT AND PLAN Ms. Morfin is a 81 y.o. with:  1. Prolapse of anterior vaginal wall   2. SUI (stress urinary incontinence, female)   3. Overactive bladder   4. Urinary frequency   5. Chronic idiopathic constipation    Stage II anterior, Stage I posterior, Stage I apical prolapse - She is currently happy with her pessary and not interested in surgery.  - Continue with #4 incontinence ring pessary. Will come for cleanings q 3 months  2. SUI - For treatment of stress urinary incontinence,  non-surgical options include expectant management, weight loss, physical therapy, as well as a pessary.  Surgical options include a midurethral sling, Burch urethropexy, and transurethral injection of a bulking agent. - She is not interested in a big surgical procedure but we discussed the option of urethral bulking which can be done in the office with minimal risk and does not require any recovery time. Handout provided and she will consider this option.   3. OAB - We discussed the symptoms of overactive bladder (OAB), which include urinary urgency, urinary frequency, nocturia, with or without urge incontinence.  While we do not know the exact etiology of OAB, several treatment options exist. We discussed management including behavioral therapy (decreasing bladder irritants, urge suppression strategies, timed voids, bladder retraining), physical therapy, medication; for refractory cases posterior tibial nerve stimulation, sacral neuromodulation, and intravesical botulinum toxin injection.  - For now, she will continue with  Myrbetriq 50mg  daily. She is also considering PTNS and handout provided.   4. Constipation - For constipation, we reviewed the importance of a better bowel regimen.  We also discussed the importance of avoiding chronic straining, as it can exacerbate her pelvic floor symptoms. We discussed initiating therapy with increasing fluid intake, fiber supplementation.  - She also has rare bowel leakage and we discussed that a daily fiber supplement will help with stool bulking and can reduce bowel leakage.   Return 3 months for pessary cleaning or sooner if needed   Jaquita Folds, MD

## 2022-03-10 ENCOUNTER — Telehealth: Payer: Self-pay | Admitting: Obstetrics and Gynecology

## 2022-03-10 NOTE — Telephone Encounter (Signed)
Patient called and said she would like to go with urethral bulking procedure.

## 2022-05-08 ENCOUNTER — Ambulatory Visit: Payer: TRICARE For Life (TFL) | Admitting: Obstetrics and Gynecology

## 2022-05-29 ENCOUNTER — Ambulatory Visit: Payer: TRICARE For Life (TFL) | Admitting: Obstetrics and Gynecology

## 2022-06-05 ENCOUNTER — Ambulatory Visit: Payer: TRICARE For Life (TFL) | Admitting: Obstetrics and Gynecology

## 2022-06-09 ENCOUNTER — Encounter: Payer: Self-pay | Admitting: *Deleted

## 2022-06-26 ENCOUNTER — Ambulatory Visit: Payer: TRICARE For Life (TFL) | Admitting: Obstetrics and Gynecology

## 2022-07-10 ENCOUNTER — Ambulatory Visit: Payer: Medicare Other | Admitting: Obstetrics and Gynecology

## 2022-07-10 ENCOUNTER — Encounter: Payer: Self-pay | Admitting: Obstetrics and Gynecology

## 2022-07-10 VITALS — BP 128/78 | HR 85

## 2022-07-10 DIAGNOSIS — N393 Stress incontinence (female) (male): Secondary | ICD-10-CM | POA: Diagnosis not present

## 2022-07-10 DIAGNOSIS — R350 Frequency of micturition: Secondary | ICD-10-CM

## 2022-07-10 LAB — POCT URINALYSIS DIPSTICK
Bilirubin, UA: NEGATIVE
Blood, UA: NEGATIVE
Glucose, UA: NEGATIVE
Ketones, UA: NEGATIVE
Leukocytes, UA: NEGATIVE
Nitrite, UA: NEGATIVE
Protein, UA: NEGATIVE
Spec Grav, UA: >=1.03 — AB (ref 1.010–1.025)
Urobilinogen, UA: 0.2 E.U./dL
pH, UA: 5.5 (ref 5.0–8.0)

## 2022-07-10 MED ORDER — LIDOCAINE-EPINEPHRINE 1 %-1:100000 IJ SOLN
10.0000 mL | Freq: Once | INTRAMUSCULAR | Status: AC
  Administered 2022-07-10: 10 mL

## 2022-07-10 MED ORDER — LIDOCAINE HCL URETHRAL/MUCOSAL 2 % EX GEL
1.0000 | Freq: Once | CUTANEOUS | Status: AC
  Administered 2022-07-10: 1 via URETHRAL

## 2022-07-10 NOTE — Addendum Note (Signed)
Addended by: Jaquita Folds on: 07/10/2022 11:48 AM   Modules accepted: Orders

## 2022-07-10 NOTE — Patient Instructions (Signed)
Taking Care of Yourself after Urodynamics, Cystoscopy, Bulkamid Injection, or Botox Injection   Drink plenty of water for a day or two following your procedure. Try to have about 8 ounces (one cup) at a time, and do this 6 times or more per day unless you have fluid restrictitons AVOID irritative beverages such as coffee, tea, soda, alcoholic or citrus drinks for a day or two, as this may cause burning with urination.  For the first 1-2 days after the procedure, your urine may be pink or red in color. You may have some blood in your urine as a normal side effect of the procedure. Large amounts of bleeding or difficulty urinating are NOT normal. Call the nurse line if this happens or go to the nearest Emergency Room if the bleeding is heavy or you cannot urinate at all and it is after hours. If you had a Bulkamid injection in the urethra and need to be catheterized, ask for a pediatric catheter to be used (size 10 or 12-French) so the material is not pushed out of place.   You may experience some discomfort or a burning sensation with urination after having this procedure. You can use over the counter Azo or pyridium to help with burning and follow the instructions on the packaging. If it does not improve within 1-2 days, or other symptoms appear (fever, chills, or difficulty urinating) call the office to speak to a nurse.  You may return to normal daily activities such as work, school, driving, exercising and housework on the day of the procedure. If your doctor gave you a prescription, take it as ordered.     

## 2022-07-10 NOTE — Progress Notes (Signed)
Bulkamid Injection  CC: 82 y.o. 82 y.o. F with stress incontinence who presents for transurethral Bulkamid injection.  Patient signed her consent form.  She started antibiotic prophylaxis today (ciprofloxacin).  POC urine: negative  Vitals:   07/10/22 1008  BP: 128/78  Pulse: 85     Procedure: Time out was performed. The bladder was catheterized and 10 ml of 2% lidocaine jelly placed in the urethra and hurricane jelly was placed at the urethra. A urethral block was performed by injecting 69ml of 1% lidocaine with epinephrine at 3 and 6 o'clock adjacent to the urethra.  The needle was primed.  The cystoscope was inserted to the level of the bladder neck.  The needle was inserted 2 cm and the scope was pulled back into the urethra 2 cm.  The needle was inserted bevel up at the 5 o'clock position and the Bulkamid was injected to obtain coaptation.  This was repeated at the 2 o'clock,  10 o'clock and 7 o'clock positions.   A total of 2- 31ml syringes were used and good circumferential coaptation was noted.  The patient tolerated the procedure well. She was asked to void after the procedure.  Post-Void Residual (PVR) by Bladder Scan: In order to evaluate bladder emptying, we discussed obtaining a postvoid residual and she agreed to this procedure.  Procedure: The ultrasound unit was placed on the patient's abdomen in the suprapubic region after the patient had voided. A PVR of 43 ml was obtained by bladder scan.  ASSESSMENT: 82 y.o. 82 y.o. s/p transurethral Bulkamid injection for stress incontinence.   PLAN: - Patient will follow up in 4 weeks to reassess. Voiding and post-procedure precautions were given. She will return for heavy bleeding, fevers, dysuria lasting beyond today and incomplete emptying. - We discussed she should still continue with her Myrbetriq as that will treat her OAB symptoms.   All questions were answered.  Jaquita Folds, MD

## 2022-07-10 NOTE — Addendum Note (Signed)
Addended by: Elita Quick on: 07/10/2022 11:43 AM   Modules accepted: Orders

## 2022-07-30 ENCOUNTER — Other Ambulatory Visit: Payer: Self-pay | Admitting: Physical Medicine & Rehabilitation

## 2022-07-30 DIAGNOSIS — R260 Ataxic gait: Secondary | ICD-10-CM

## 2022-08-07 ENCOUNTER — Ambulatory Visit: Payer: TRICARE For Life (TFL) | Admitting: Obstetrics and Gynecology

## 2022-08-08 ENCOUNTER — Ambulatory Visit
Admission: RE | Admit: 2022-08-08 | Discharge: 2022-08-08 | Disposition: A | Discharge status: Home / Self Care | Payer: Medicare Other | Source: Ambulatory Visit | Attending: Physical Medicine & Rehabilitation | Admitting: Physical Medicine & Rehabilitation

## 2022-08-08 DIAGNOSIS — R260 Ataxic gait: Secondary | ICD-10-CM

## 2022-08-11 NOTE — Progress Notes (Unsigned)
[facility] Return Visit  SUBJECTIVE  History of Present Illness: Caren Garske is a 82 y.o. female seen in follow-up for Urethral bulking. She reports she feels like she has a little bit of overactive bladder. She has been taking Myrbetriq  for OAB.     Past Medical History: Patient  has a past medical history of Arthritis, Back pain, Lung cancer ([facility]), and Rheumatoid arthritis ([facility]).   Past Surgical History: She  has a past surgical history that includes Breast biopsy (Left); Lung removal, partial (Right); Replacement total knee bilateral; and Spinal fusion.   Medications: She has a current medication list which includes the following prescription(s): vitamin d3, fluoxetine, folic acid, mirabegron er, pantoprazole, and plaquenil, and the following Facility-Administered Medications: betamethasone acetate-betamethasone sodium phosphate and betamethasone acetate-betamethasone sodium phosphate.   Allergies: Patient is allergic to latex and nsaids.   Social History: Patient  reports that she quit smoking about 28 years ago. Her smoking use included cigarettes. She started smoking about 62 years ago. She smoked an average of 1 pack per day. She has never used smokeless tobacco. She reports current alcohol use of about 7.0 standard drinks of alcohol per week. She reports that she does not use drugs.      OBJECTIVE     Physical Exam: Vitals:   08/12/22 0954  BP: 135/75  Pulse: 76   Gen: No apparent distress, A&O x 3.  Detailed Urogynecologic Evaluation:  Deferred.    ASSESSMENT AND PLAN    Ms. Tewell is a 82 y.o. with:  1. SUI (stress urinary incontinence, female)   2. Prolapse of anterior vaginal wall   3. Overactive bladder    Patient is happy with the urethral bulking. She reports is 100% dry and is amazed. Encouraged patient if she has a reoccurrence of leakage to contact us but the data encourages that the bulking should last approximately 7 years.   Patient reports her OB manages her pessary and she is comfortable with them.  Patient reports she feels she is mostly well managed with Myrbetriq. She pays approximately 10$ a month at this time.   Patient to follow up as needed.

## 2022-08-12 ENCOUNTER — Encounter: Payer: Self-pay | Admitting: Obstetrics and Gynecology

## 2022-08-12 ENCOUNTER — Ambulatory Visit: Payer: Medicare Other | Admitting: Obstetrics and Gynecology

## 2022-08-12 VITALS — BP 135/75 | HR 76

## 2022-08-12 DIAGNOSIS — N393 Stress incontinence (female) (male): Secondary | ICD-10-CM | POA: Diagnosis not present

## 2022-08-12 DIAGNOSIS — N8110 Cystocele, unspecified: Secondary | ICD-10-CM | POA: Diagnosis not present

## 2022-08-12 DIAGNOSIS — N3281 Overactive bladder: Secondary | ICD-10-CM

## 2022-09-23 ENCOUNTER — Other Ambulatory Visit: Payer: Self-pay | Admitting: Infectious Diseases

## 2022-09-23 DIAGNOSIS — Z1231 Encounter for screening mammogram for malignant neoplasm of breast: Secondary | ICD-10-CM

## 2022-10-03 ENCOUNTER — Ambulatory Visit
Admission: RE | Admit: 2022-10-03 | Discharge: 2022-10-03 | Disposition: A | Discharge status: Home / Self Care | Payer: Medicare Other | Source: Ambulatory Visit | Attending: Infectious Diseases | Admitting: Infectious Diseases

## 2022-10-03 DIAGNOSIS — Z1231 Encounter for screening mammogram for malignant neoplasm of breast: Secondary | ICD-10-CM

## 2022-10-08 ENCOUNTER — Other Ambulatory Visit: Payer: Self-pay | Admitting: Infectious Diseases

## 2022-10-08 DIAGNOSIS — R928 Other abnormal and inconclusive findings on diagnostic imaging of breast: Secondary | ICD-10-CM

## 2022-10-08 DIAGNOSIS — N63 Unspecified lump in unspecified breast: Secondary | ICD-10-CM

## 2022-10-13 ENCOUNTER — Ambulatory Visit
Admission: RE | Admit: 2022-10-13 | Discharge: 2022-10-13 | Disposition: A | Discharge status: Home / Self Care | Payer: Medicare Other | Source: Ambulatory Visit | Attending: Infectious Diseases | Admitting: Infectious Diseases

## 2022-10-13 DIAGNOSIS — N63 Unspecified lump in unspecified breast: Secondary | ICD-10-CM | POA: Insufficient documentation

## 2022-10-13 DIAGNOSIS — R928 Other abnormal and inconclusive findings on diagnostic imaging of breast: Secondary | ICD-10-CM | POA: Diagnosis present

## 2022-10-15 ENCOUNTER — Other Ambulatory Visit: Payer: Self-pay | Admitting: Infectious Diseases

## 2022-10-15 DIAGNOSIS — N63 Unspecified lump in unspecified breast: Secondary | ICD-10-CM

## 2022-10-15 DIAGNOSIS — R928 Other abnormal and inconclusive findings on diagnostic imaging of breast: Secondary | ICD-10-CM

## 2022-10-22 ENCOUNTER — Ambulatory Visit
Admission: RE | Admit: 2022-10-22 | Discharge: 2022-10-22 | Disposition: A | Discharge status: Home / Self Care | Payer: Medicare Other | Source: Ambulatory Visit | Attending: Infectious Diseases | Admitting: Infectious Diseases

## 2022-10-22 DIAGNOSIS — N63 Unspecified lump in unspecified breast: Secondary | ICD-10-CM | POA: Diagnosis present

## 2022-10-22 DIAGNOSIS — R928 Other abnormal and inconclusive findings on diagnostic imaging of breast: Secondary | ICD-10-CM | POA: Diagnosis present

## 2022-10-22 MED ORDER — LIDOCAINE HCL (PF) 1 % IJ SOLN
10.0000 mL | Freq: Once | INTRAMUSCULAR | Status: AC
  Administered 2022-10-22: 10 mL via INTRADERMAL
  Filled 2022-10-22: qty 10

## 2022-10-28 ENCOUNTER — Other Ambulatory Visit: Payer: Self-pay | Admitting: Infectious Diseases

## 2022-10-28 DIAGNOSIS — Z85118 Personal history of other malignant neoplasm of bronchus and lung: Secondary | ICD-10-CM

## 2022-10-28 DIAGNOSIS — R062 Wheezing: Secondary | ICD-10-CM

## 2022-11-03 ENCOUNTER — Ambulatory Visit
Admission: RE | Admit: 2022-11-03 | Discharge: 2022-11-03 | Disposition: A | Discharge status: Home / Self Care | Payer: Medicare Other | Source: Ambulatory Visit | Attending: Infectious Diseases | Admitting: Infectious Diseases

## 2022-11-03 DIAGNOSIS — R062 Wheezing: Secondary | ICD-10-CM | POA: Diagnosis present

## 2022-11-03 DIAGNOSIS — Z85118 Personal history of other malignant neoplasm of bronchus and lung: Secondary | ICD-10-CM | POA: Insufficient documentation

## 2022-11-21 ENCOUNTER — Other Ambulatory Visit: Payer: Self-pay | Admitting: Infectious Diseases

## 2022-11-21 DIAGNOSIS — R062 Wheezing: Secondary | ICD-10-CM

## 2022-11-21 DIAGNOSIS — R590 Localized enlarged lymph nodes: Secondary | ICD-10-CM

## 2022-11-24 ENCOUNTER — Ambulatory Visit: Admission: RE | Admit: 2022-11-24 | Payer: TRICARE For Life (TFL) | Source: Ambulatory Visit

## 2022-11-26 ENCOUNTER — Ambulatory Visit
Admission: RE | Admit: 2022-11-26 | Discharge: 2022-11-26 | Disposition: A | Discharge status: Home / Self Care | Payer: Medicare Other | Source: Ambulatory Visit | Attending: Infectious Diseases | Admitting: Infectious Diseases

## 2022-11-26 DIAGNOSIS — R590 Localized enlarged lymph nodes: Secondary | ICD-10-CM | POA: Diagnosis not present

## 2022-11-26 DIAGNOSIS — R062 Wheezing: Secondary | ICD-10-CM | POA: Diagnosis present

## 2022-11-26 MED ORDER — IOHEXOL 300 MG/ML  SOLN
100.0000 mL | Freq: Once | INTRAMUSCULAR | Status: AC | PRN
  Administered 2022-11-26: 100 mL via INTRAVENOUS

## 2023-01-29 ENCOUNTER — Other Ambulatory Visit: Payer: Self-pay | Admitting: Infectious Diseases

## 2023-01-29 DIAGNOSIS — Z85118 Personal history of other malignant neoplasm of bronchus and lung: Secondary | ICD-10-CM

## 2023-01-29 DIAGNOSIS — R062 Wheezing: Secondary | ICD-10-CM

## 2023-01-29 DIAGNOSIS — J40 Bronchitis, not specified as acute or chronic: Secondary | ICD-10-CM

## 2023-02-06 ENCOUNTER — Ambulatory Visit
Admission: RE | Admit: 2023-02-06 | Discharge: 2023-02-06 | Disposition: A | Discharge status: Home / Self Care | Payer: Medicare Other | Source: Ambulatory Visit | Attending: Infectious Diseases | Admitting: Infectious Diseases

## 2023-02-06 DIAGNOSIS — Z85118 Personal history of other malignant neoplasm of bronchus and lung: Secondary | ICD-10-CM | POA: Diagnosis present

## 2023-02-06 DIAGNOSIS — J40 Bronchitis, not specified as acute or chronic: Secondary | ICD-10-CM | POA: Insufficient documentation

## 2023-02-06 DIAGNOSIS — R062 Wheezing: Secondary | ICD-10-CM | POA: Insufficient documentation

## 2023-05-27 NOTE — Progress Notes (Signed)
 Chief Complaint:   Crystal Malone is a 83 y.o. female here for Pessary Management.  History of Present Illness: Patient returns for a pessary check. 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 Pt is also managing MUI with Vesicare 5mg /day (okay to take 10 mg/day). She is using topical vaginal estrogen nightly as well. Pt had reported fecal incontinence, for which I have referred PFPT (for prolapse as well) and urogyn. She saw [facility] in [place] and liked her.   She has been going to PFPT and has plans for urethral bulking in 06/2022 -> this worked for her so well and she feels great   Today:  Still having to wear a pad but pessary does help her.  She denies bothersome vaginal symptoms, including discharge, irritation, itching or pain. She is able to empty her bowels and bladder without obstruction or concern.  She went to [place] since her last visit and had a great time.   Pertinent Hx: -Grade 1 posterior, grade 2 anterior prolapse --> ring w/ support w/ knob #4 -MUI, OAB --> Myrbetriq 50mg /daily  -Urogyn consult 12/2019 but pt did not think necessary, returned  -Hx of lung cancer -Hx of PE -NSVD x3 -Depression, worsened since husband's death; improvement with Wellbutrin -Hx of PFPT in the past, with some improvement  -Dx UTI 07/28/20  -hx of DVT  Past Medical History:  has a past medical history of Allergy (Nsaids, latex tape), Arthritis, Asthma (HHS-HCC) (02/14/2019), Asthma, unspecified asthma severity, unspecified whether complicated, unspecified whether persistent (HHS-HCC), Gastroesophageal reflux disease without esophagitis (02/14/2019), History of cancer (2012), History of cataract (both eyes operated on 2020), History of lung cancer (02/14/2019), Lower esophageal ring (Schatzki), 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  Past Surgical History:  has a past surgical history that includes Replacement total knee bilateral; nose surgery; lung surgery; neck surgery; foot surgery; Fracture surgery (10/2018); Cataract extraction (2020); Tonsillectomy (1948); Tubal ligation (1975); Joint replacement (20008, 2010); Spine surgery (fusion C-5,6,7 2004?); Breast biopsy; Dilation and curettage of uterus (1971); and Back surgery (fusion C-5,6,7  '04?). Family History: family history includes Asthma in her brother; Colon polyps in her mother; Diabetes in her brother and mother; Diabetes type II in her brother and mother; Heart failure in her father; Melanoma in her father; Rheum arthritis in her brother; Skin cancer in her father; Stroke in her mother. Social History:  reports that she quit smoking about 29 years ago. Her smoking use included cigarettes. She started smoking about 64 years ago. She has a 35 pack-year smoking history. She has been exposed to tobacco smoke. She has never used smokeless tobacco. She reports current alcohol use of about 4.0 standard drinks of alcohol per week. She reports that she does not use drugs. OB/GYN History:  OB History     Gravida  5   Para  3   Term  3   Preterm      AB  2   Living  3      SAB  2   IAB      Ectopic      Molar      Multiple      Live Births  3         Allergies: is allergic to latex and  nsaids (non-steroidal anti-inflammatory drug). Medications:  Current Outpatient Medications:  .  acetaminophen (TYLENOL) 500 MG tablet, Take 1,000 mg by mouth every 6 (six) hours as needed for Pain, Disp: , Rfl:  .  albuterol (PROVENTIL) 2.5 mg /3 mL (0.083 %) nebulizer solution, Take 3 mLs (2.5 mg total) by nebulization every 6 (six) hours as needed for Wheezing, Disp: 75 mL, Rfl: 12 .  amoxicillin-clavulanate (AUGMENTIN) 875-125 mg tablet,  Take 1 tablet (875 mg total) by mouth 2 (two) times daily for 10 days, Disp: 20 tablet, Rfl: 0 .  cholecalciferol (VITAMIN D3) 2,000 unit tablet, Take 2,000 Units by mouth once daily, Disp: , Rfl:  .  estradioL (ESTRACE) 0.01 % (0.1 mg/gram) vaginal cream, Place 2 g vaginally once daily Insert pea sized amount nightly vaginally x 2 weeks, then every other night vaginally x 2 weeks, then 2-3 times weekly for maintenance., Disp: 30 g, Rfl: 2 .  FLUoxetine (PROZAC) 40 MG capsule, TAKE 1 CAPSULE DAILY, Disp: 90 capsule, Rfl: 3 .  fluticasone furoate (ARNUITY ELLIPTA) 100 mcg/actuation DsDv, Inhale 1 Puff (1 spray total) into the lungs once daily, Disp: 3 each, Rfl: 1 .  folic acid (FOLVITE) 1 MG tablet, Take 1 tablet (1 mg total) by mouth once daily, Disp: 90 tablet, Rfl: 3 .  hydroxychloroquine (PLAQUENIL) 200 mg tablet, Take 1 tablet (200 mg total) by mouth 2 (two) times daily, Disp: 180 tablet, Rfl: 1 .  methotrexate (RHEUMATREX) 2.5 MG tablet, Take 8 tablets (20 mg total) by mouth every 7 (seven) days All on the same day, Disp: 96 tablet, Rfl: 1 .  MYRBETRIQ 25 mg ER Tablet, TAKE 1 TABLET IN THE MORNING AND 1 TABLET BEFORE BEDTIME (TWICE A DAY), Disp: 180 tablet, Rfl: 3 .  nystatin (MYCOSTATIN) 100,000 unit/gram cream, USE WITH TRIAMCINOLONE OINTMENT AS NEEDED, Disp: 30 g, Rfl: 1 .  pantoprazole (PROTONIX) 40 MG DR tablet, Take 1 tablet (40 mg total) by mouth 2 (two) times daily before meals, Disp: 180 tablet, Rfl: 3 .  triamcinolone 0.1 % ointment, USE WITH NYSTATIN CREAM AS NEEDED, Disp: 30 g, Rfl: 2  Review of Systems: See  HPI   Exam:   BP 115/75   Pulse 71   Ht 165.1 cm (5' 5)   Wt 93.4 kg (206 lb)   LMP 04/21/1990 (Approximate)   BMI 34.28 kg/m   General: Well-developed, well-nourished  female in no acute distress Skin: No rashes, ulcers or skin lesions noted. No excessive hirsutism or acne noted.  Neurological: Appears alert and oriented and is a good historian. No gross  abnormalities are noted. Psychological: Normal affect and mood. No signs of anxiety or depression noted.  Pelvic exam:        External: Tanner stage 5, Labia majora and minora appear normal.  +atrophy        Bladder: Normal size without masses or tenderness        Urethra: No lesions or discharge with palpation. Normal urethral size and location        Urethral meatus: normal, not tender        Vagina: + discharge, without lesions or masses.  +pessary in situ, removed and replaced without difficulty. No vaginal wall irritation        Cervix: Normal without lesions or masses          Adnexa: Normal bimanual exam without masses or fullness        Uterus: Normal size and position without masses or tenderness  Anus/Perineum: No external hemorrhoids, masses or fissures     Chaperone present for pelvic exam.   Impression:   The encounter diagnosis was Vulvar itching.  Plan:   - Pessary management:  -Pt doing well with pessary. Bladder and bowel function are  appropriate. No pain or irritation. Continued pelvic organ support desired. Pt will return for office management and cleaning of her pessary in 4 months  Continue myrbetriq as desired Triamcinolone for lichen simplex chronicus   Assessment & Plan Pessary use Pessary well tolerated and in place. No discomfort or complications noted during examination. -Continue current pessary use.  Pruritus Reports itching on the vulva. No other symptoms suggestive of yeast infection. -Refill triamcinolone topical ointment.  Wrist fracture Reports recent wrist fracture with upcoming surgery. No significant pain reported. -Continue with planned surgical intervention.  Follow-up Given the current pessary use and recent wrist fracture, a follow-up in 4 months is suggested. However, this can be extended to 5-6 months if patient prefers. -Schedule follow-up appointment in 4-6 months.  Diagnoses and all orders for this visit:  Vulvar  itching -     triamcinolone 0.1 % ointment; USE WITH NYSTATIN CREAM AS NEEDED    Return in about 4 months (around 09/24/2023).  ~~~~~~~~~~~~~~~~~~~~~~~~~~~~~~~~~~~~~~~~~~~~~~~~~~~~~~~~~~~~ This note is partially written by Marylynn Baptist, in the presence of and acting as the scribe of Dr. Heather Penton, who has reviewed, edited and added to the note to reflect her best personal medical judgment.  This note was generated in part with voice recognition software and I apologize for any typographical errors that were not detected and corrected.    Attestation Statement:   I personally performed the service. (TP)  BETHANY JOHNATHAN PENTON, MD

## 2023-09-08 ENCOUNTER — Other Ambulatory Visit: Payer: Self-pay | Admitting: Infectious Diseases

## 2023-09-08 DIAGNOSIS — Z1231 Encounter for screening mammogram for malignant neoplasm of breast: Secondary | ICD-10-CM

## 2023-10-06 ENCOUNTER — Ambulatory Visit
Admission: RE | Admit: 2023-10-06 | Discharge: 2023-10-06 | Disposition: A | Discharge status: Home / Self Care | Source: Ambulatory Visit | Attending: Infectious Diseases | Admitting: Infectious Diseases

## 2023-10-06 DIAGNOSIS — Z1231 Encounter for screening mammogram for malignant neoplasm of breast: Secondary | ICD-10-CM | POA: Insufficient documentation

## 2023-10-08 NOTE — Progress Notes (Signed)
 Chief Complaint:   Crystal Malone is a 83 y.o. female here for Pessary Management.  History of Present Illness: Patient returns for a pessary check. 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 Pt is also managing MUI with Vesicare 5mg /day (okay to take 10 mg/day). She is using topical vaginal estrogen nightly as well. Pt had reported fecal incontinence, for which I have referred PFPT (for prolapse as well) and urogyn. She saw [facility] in [place] and liked her.   She has been going to PFPT and has plans for urethral bulking in 06/2022 -> this worked for her and she feels great . She will go back for a tune up later this year. History of Present Illness   She experiences bladder leakage issues that have gradually returned over the past year and a half. She previously underwent urethral bulking with Dr. Marilynne in [place].  She uses a pessary without any issues and does not notice it.  She has occasional irritation in the groin area, which is itchy and stings when scratched. This irritation is intermittent and primarily affects the left side but is bilateral.   Today:  Still having to wear a pad but pessary does help her.  She denies bothersome vaginal symptoms, including discharge, irritation, itching or pain. She is able to empty her bowels and bladder without obstruction or concern.  She goes to [place] in the summers to visit her brother  Pertinent Hx: -Grade 1 posterior, grade 2 anterior prolapse --> ring w/ support w/ knob #4 -MUI, OAB --> Myrbetriq 50mg /daily  -Urogyn consult 12/2019 but pt did not think necessary, returned  -Hx of lung cancer -Hx of PE -NSVD x3 -Depression, worsened since husband's death; improvement with Wellbutrin -Hx of PFPT in the past, with some improvement  -Dx UTI 07/28/20  -hx of DVT  Past Medical History:  has a past medical history of Allergy (Nsaids, latex tape),  Arthritis, Asthma (HHS-HCC) (02/14/2019), Asthma, unspecified asthma severity, unspecified whether complicated, unspecified whether persistent (HHS-HCC), Gastroesophageal reflux disease without esophagitis (02/14/2019), Gastrointestinal ulcer, History of cancer (2012), History of cataract (both eyes operated on 2020), History of lung cancer (02/14/2019), Lower esophageal ring (Schatzki), 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  Past Surgical History:  has a past surgical history that includes Replacement total knee bilateral; nose surgery; lung surgery; neck surgery; foot surgery; Fracture surgery (10/2018); Cataract extraction (2020); Tonsillectomy (1948); Tubal ligation (1975); Joint replacement (20008, 2010); Spine surgery (fusion C-5,6,7 2004?); Breast biopsy; Dilation and curettage of uterus (1971); Back surgery (fusion C-5,6,7  '04?); and Bronchoscopy (2010). Family History: family history includes Asthma in her brother; Colon polyps in her mother; Diabetes in her brother and mother; Diabetes type II in her brother and mother; Heart failure in her father; Melanoma in her father; Rheum arthritis in her brother; Skin cancer in her father; Stroke in her mother. Social History:  reports that she quit smoking about 29 years ago. Her smoking use included cigarettes. She started smoking about 64 years ago. She has a 35 pack-year smoking history. She has been exposed to tobacco smoke. She has never used smokeless tobacco. She reports current alcohol use of about 4.0 standard drinks of alcohol per week. She reports that she does not use drugs.  OB/GYN History:  OB History     Gravida  5   Para  3   Term  3   Preterm      AB  2   Living  3      SAB  2    IAB      Ectopic      Molar      Multiple      Live Births  3         Allergies: is allergic to latex and nsaids (non-steroidal anti-inflammatory drug). Medications:  Current Outpatient Medications:  .  acetaminophen (TYLENOL) 500 MG tablet, Take 1,000 mg by mouth every 6 (six) hours as needed for Pain, Disp: , Rfl:  .  albuterol (PROVENTIL) 2.5 mg /3 mL (0.083 %) nebulizer solution, Take 3 mLs (2.5 mg total) by nebulization every 6 (six) hours as needed for Wheezing, Disp: 75 mL, Rfl: 12 .  cholecalciferol (VITAMIN D3) 2,000 unit tablet, Take 2,000 Units by mouth once daily, Disp: , Rfl:  .  codeine-guaiFENesin 10-100 mg/5 mL oral liquid, Take 5 mLs by mouth every 6 (six) hours as needed, Disp: 118 mL, Rfl: 0 .  estradioL (ESTRACE) 0.01 % (0.1 mg/gram) vaginal cream, Place 2 g vaginally once daily Insert pea sized amount nightly vaginally x 2 weeks, then every other night vaginally x 2 weeks, then 2-3 times weekly for maintenance., Disp: 30 g, Rfl: 2 .  FLUoxetine (PROZAC) 40 MG capsule, TAKE 1 CAPSULE DAILY, Disp: 90 capsule, Rfl: 3 .  fluticasone furoate (ARNUITY ELLIPTA) 100 mcg/actuation DsDv, USE 1 INHALATION ONCE DAILY, Disp: 90 each, Rfl: 3 .  folic acid (FOLVITE) 1 MG tablet, Take 1 tablet (1 mg total) by mouth once daily, Disp: 90 tablet, Rfl: 3 .  hydroxychloroquine (PLAQUENIL) 200 mg tablet, Take 1 tablet (200 mg total) by mouth 2 (two) times daily, Disp: 180 tablet, Rfl: 1 .  methotrexate (RHEUMATREX) 2.5 MG tablet, Take 8 tablets (20 mg total) by mouth every 7 (seven) days All on the same day, Disp: 96 tablet, Rfl: 1 .  MYRBETRIQ 25 mg ER Tablet, TAKE 1 TABLET IN THE MORNING AND 1 TABLET BEFORE BEDTIME (TWICE A DAY), Disp: 180 tablet, Rfl: 3 .  nystatin (MYCOSTATIN) 100,000 unit/gram cream, USE WITH TRIAMCINOLONE OINTMENT AS NEEDED, Disp: 30 g, Rfl: 1 .  pantoprazole (PROTONIX) 40 MG DR tablet, Take 1 tablet (40 mg total) by mouth 2 (two) times daily before meals, Disp:  180 tablet, Rfl: 3 .  triamcinolone 0.1 % ointment, USE WITH NYSTATIN CREAM AS NEEDED, Disp: 30 g, Rfl: 2  Review of Systems: See  HPI   Exam:   BP 129/79   Pulse 72   Ht 165.1 cm (5' 5)   Wt 96.1 kg (211 lb 12.8 oz)   LMP 04/21/1990 (Approximate)   BMI 35.25 kg/m   General: Well-developed, well-nourished  female in no acute distress Skin: No rashes, ulcers or skin lesions noted. No excessive hirsutism or acne noted.  Neurological: Appears alert and oriented and is a good historian. No gross abnormalities are noted. Psychological: Normal affect and mood. No signs of anxiety or depression noted.  Pelvic exam:        External: Tanner stage 5, Labia majora and minora appear normal.  +atrophy        Bladder: Normal size without masses or tenderness        Urethra: No lesions or discharge with palpation. Normal urethral size and location  Urethral meatus: normal, not tender        Vagina: + discharge, without lesions or masses.  +pessary in situ, removed and replaced without difficulty. No vaginal wall irritation        Cervix: Normal without lesions or masses          Adnexa: Normal bimanual exam without masses or fullness        Uterus: Normal size and position without masses or tenderness        Anus/Perineum: No external hemorrhoids, masses or fissures     Chaperone present for pelvic exam.   Impression:   The encounter diagnosis was Vulvar itching.  Plan:   - Pessary management:  -Pt doing well with pessary. Bladder and bowel function are  appropriate. No pain or irritation. Continued pelvic organ support desired. Pt will return for office management and cleaning of her pessary in 4 months  Continue myrbetriq as desired Triamcinolone for lichen simplex chronicus   Assessment & Plan Pessary use Pessary well tolerated and in place. No discomfort or complications noted during examination. -Continue current pessary use.  Pruritus Reports itching on the vulva. No  other symptoms suggestive of yeast infection. -Refill triamcinolone topical ointment.  Wrist fracture Reports recent wrist fracture with upcoming surgery. No significant pain reported. -Continue with planned surgical intervention.  Follow-up Given the current pessary use and recent wrist fracture, a follow-up in 4 months is suggested. However, this can be extended to 5-6 months if patient prefers. -Schedule follow-up appointment in 4-6 months.   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 - Follow-up with Dr. Marilynne in [place] for potential urethral bulking tune-up.  Vulvar irritation Intermittent unilateral vulvar irritation likely due to yeast infection. - Prescribe nystatin cream with hydrocortisone for itching and yeast infection. - Advise mixing nystatin and hydrocortisone if insurance does not cover combined prescription.  Vitamin B12 deficiency Previously low B12 levels improved with shots; energy levels may be dropping on oral supplementation. - Check B12 levels before trip to [place]. - Coordinate with Dr. Epifanio for B12 management and potential additional shots.  Diagnoses and all orders for this visit:  Vulvar itching -     triamcinolone 0.1 % ointment; USE WITH NYSTATIN CREAM AS NEEDED -     nystatin (MYCOSTATIN) 100,000 unit/gram cream; USE WITH TRIAMCINOLONE OINTMENT AS NEEDED     Return in about 4 months (around 02/07/2024) for pessary check.  ~~~~~~~~~~~~~~~~~~~~~~~~~~~~~~~~~~~~~~~~~~~~~~~~~~~~~~~~~~~~ This note is partially written by Marylynn Baptist, in the presence of and acting as the scribe of Dr. Heather Penton, who has reviewed, edited and added to the note to reflect her best personal medical judgment.  This note was generated in part with voice recognition software and I apologize for any typographical errors that were not detected and corrected.     Attestation Statement:   I personally performed the service. (TP)  BETHANY JOHNATHAN PENTON, MD

## 2023-11-04 ENCOUNTER — Ambulatory Visit (INDEPENDENT_AMBULATORY_CARE_PROVIDER_SITE_OTHER): Admitting: Obstetrics and Gynecology

## 2023-11-04 ENCOUNTER — Encounter: Payer: Self-pay | Admitting: Obstetrics and Gynecology

## 2023-11-04 ENCOUNTER — Ambulatory Visit: Payer: Self-pay | Admitting: Obstetrics

## 2023-11-04 VITALS — BP 119/53 | HR 64

## 2023-11-04 DIAGNOSIS — N393 Stress incontinence (female) (male): Secondary | ICD-10-CM | POA: Diagnosis not present

## 2023-11-04 DIAGNOSIS — N3281 Overactive bladder: Secondary | ICD-10-CM

## 2023-11-04 LAB — POCT URINALYSIS DIP (CLINITEK)
Bilirubin, UA: NEGATIVE
Blood, UA: NEGATIVE
Glucose, UA: NEGATIVE mg/dL
Ketones, POC UA: NEGATIVE mg/dL
Nitrite, UA: NEGATIVE
POC PROTEIN,UA: NEGATIVE
Spec Grav, UA: 1.015 (ref 1.010–1.025)
Urobilinogen, UA: 0.2 U/dL
pH, UA: 5.5 (ref 5.0–8.0)

## 2023-11-04 NOTE — Progress Notes (Signed)
 [facility] Return Visit  SUBJECTIVE  History of Present Illness: Adenike Shidler is a 83 y.o. female seen in follow-up for mixed incontinence. Underwent urethral bulking in March 2024. Has been taking Myrbetriq 50mg  for OAB symptoms. Currently using a #4 incontinence ring pessary, managed by her OBGYN.   She wears a pad all day. Leakage is more with urgency, it just comes out. Occurred over the last 2-3 months. Happening multiple times a day, more in the afternoon. She switched from 25mg  Myrbetriq twice a day then now only takes it once a day.   Drinks: 1 cup coffee in AM, a few glasses of water during the day  Not noticing leakage with cough/ sneeze or movement. Does not leak with exercise.   Past Medical History: Patient  has a past medical history of Arthritis, Back pain, Lung cancer (HCC), and Rheumatoid arthritis (HCC).   Past Surgical History: She  has a past surgical history that includes Breast biopsy (Left); Lung removal, partial (Right); Replacement total knee bilateral; and Spinal fusion.   Medications: She has a current medication list which includes the following prescription(s): vitamin d3, fluoxetine, folic acid, mirabegron er, pantoprazole, plaquenil, pyridoxine, vitamin b-12, and methotrexate, and the following Facility-Administered Medications: betamethasone  acetate-betamethasone  sodium phosphate and betamethasone  acetate-betamethasone  sodium phosphate.   Allergies: Patient is allergic to latex and nsaids.   Social History: Patient  reports that she quit smoking about 29 years ago. Her smoking use included cigarettes. She started smoking about 63 years ago. She has a 34 pack-year smoking history. She has never used smokeless tobacco. She reports current alcohol use of about 7.0 standard drinks of alcohol per week. She reports that she does not use drugs.      OBJECTIVE     Physical Exam: Vitals:   11/04/23 1521  BP: (!) 119/53  Pulse: 64    Gen: No  apparent distress, A&O x 3.  Detailed Urogynecologic Evaluation:  Deferred.   Results for orders placed or performed in visit on 11/04/23  POCT URINALYSIS DIP (CLINITEK)   Collection Time: 11/04/23  3:54 PM  Result Value Ref Range   Color, UA yellow yellow   Clarity, UA clear clear   Glucose, UA negative negative mg/dL   Bilirubin, UA negative negative   Ketones, POC UA negative negative mg/dL   Spec Grav, UA 8.984 8.989 - 1.025   Blood, UA negative negative   pH, UA 5.5 5.0 - 8.0   POC PROTEIN,UA negative negative, trace   Urobilinogen, UA 0.2 0.2 or 1.0 E.U./dL   Nitrite, UA Negative Negative   Leukocytes, UA Trace (A) Negative   vst   ASSESSMENT AND PLAN    Ms. Plake is a 83 y.o. with:  1. Overactive bladder   2. SUI (stress urinary incontinence, female)     - Suspect that she is having more OAB rather than a recurrence of SUI symptoms.  Discussed options of increasing myrbertiq back to 50mg , or third line options like botox, PTNS or sacral nerve stimulation. - She wants to increase the Myrbetriq to 50mg . She believes she has enough of a prescription to last- if not we can write a new Rx for Myrbetriq 25mg  BID.  - She is going to [place] for 3 weeks to visit family and will follow up after in September.   Rosaline LOISE Caper, MD  Time spent: I spent 20 minutes dedicated to the care of this patient on the date of this encounter to include pre-visit review of  records, face-to-face time with the patient and post visit documentation.

## 2023-11-09 IMAGING — MG MM DIGITAL SCREENING BILAT W/ TOMO AND CAD
6 of 10 series · 6 of 30 positions shown · non-contrast
Comparison: None Available.

CLINICAL DATA: Screening.

EXAM:
DIGITAL SCREENING BILATERAL MAMMOGRAM WITH TOMOSYNTHESIS AND CAD
TECHNIQUE: Bilateral screening digital craniocaudal and mediolateral oblique
mammograms were obtained. Bilateral screening digital breast
tomosynthesis was performed. The images were evaluated with
computer-aided detection.

[R CC synth-2D (1 of 2)]
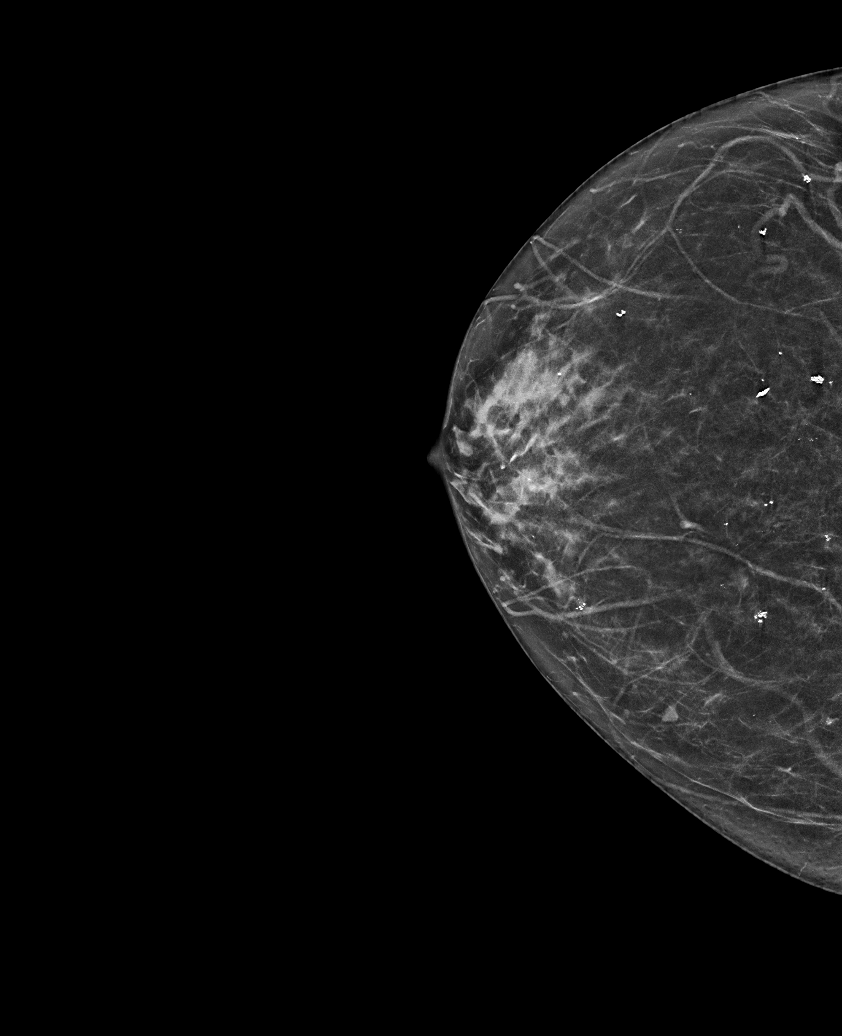

[L MLO synth-2D]
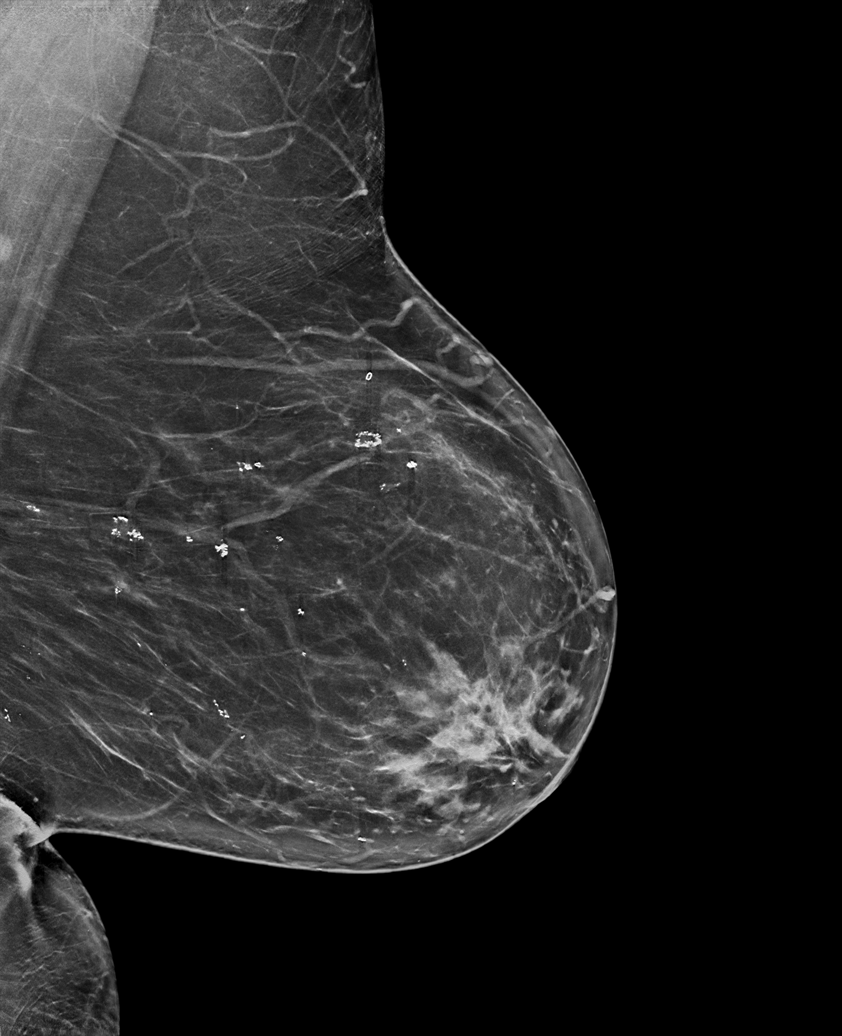

[L CC synth-2D]
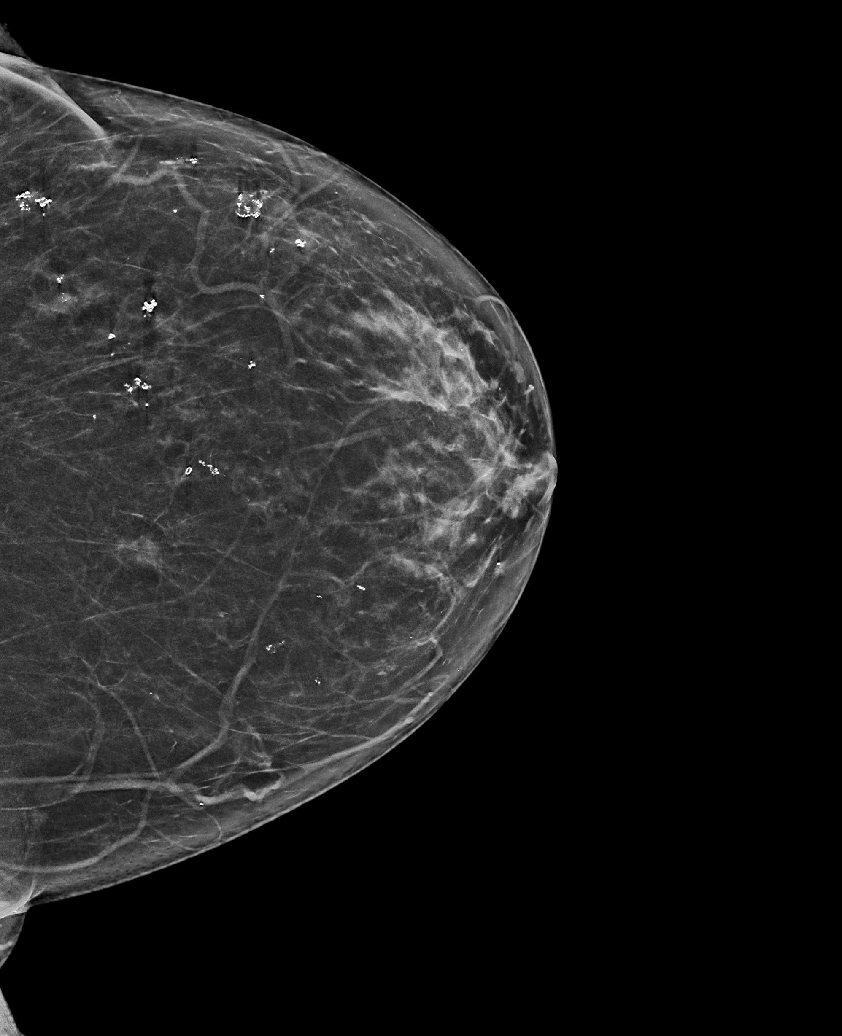

[R CC synth-2D (2 of 2)]
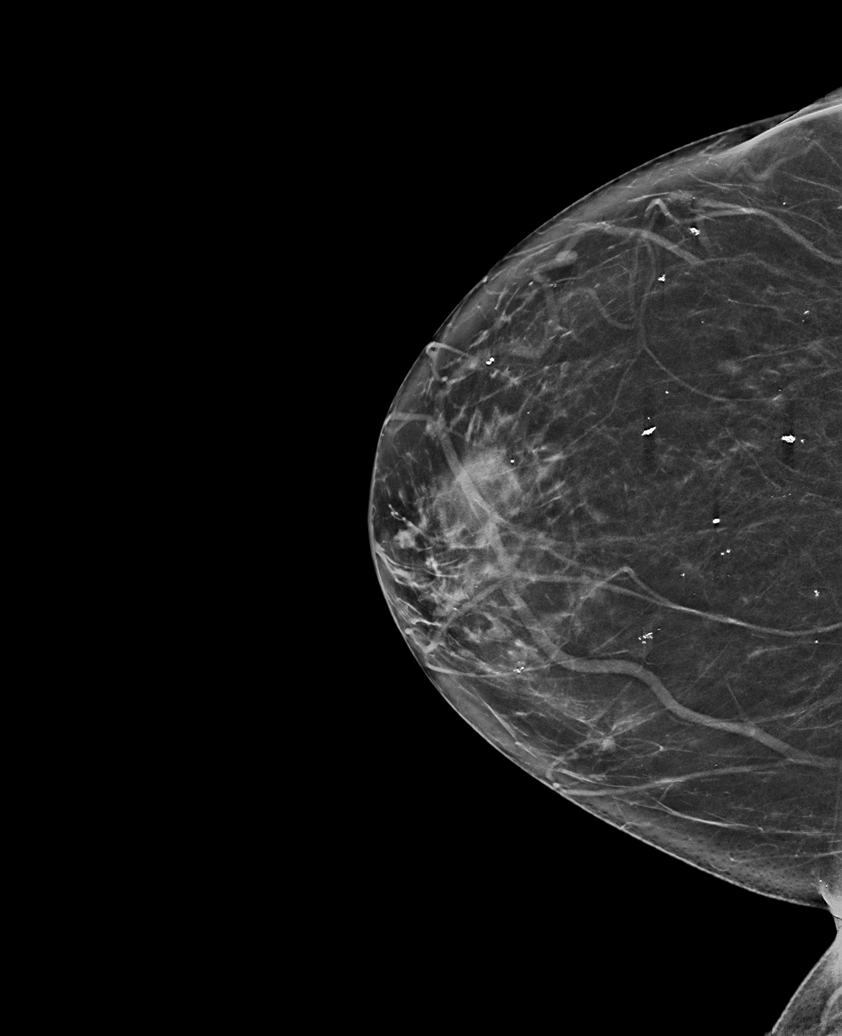

[R MLO synth-2D]
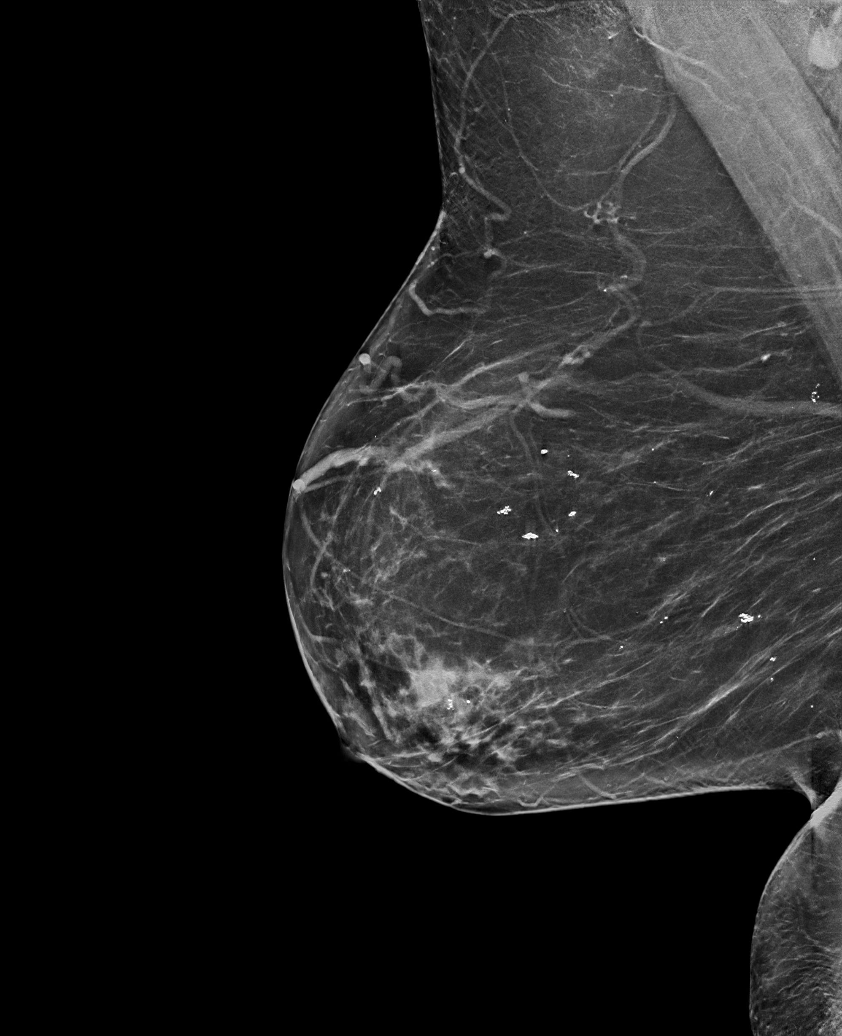

[R CC tomo · tomo slice 37/73.0]
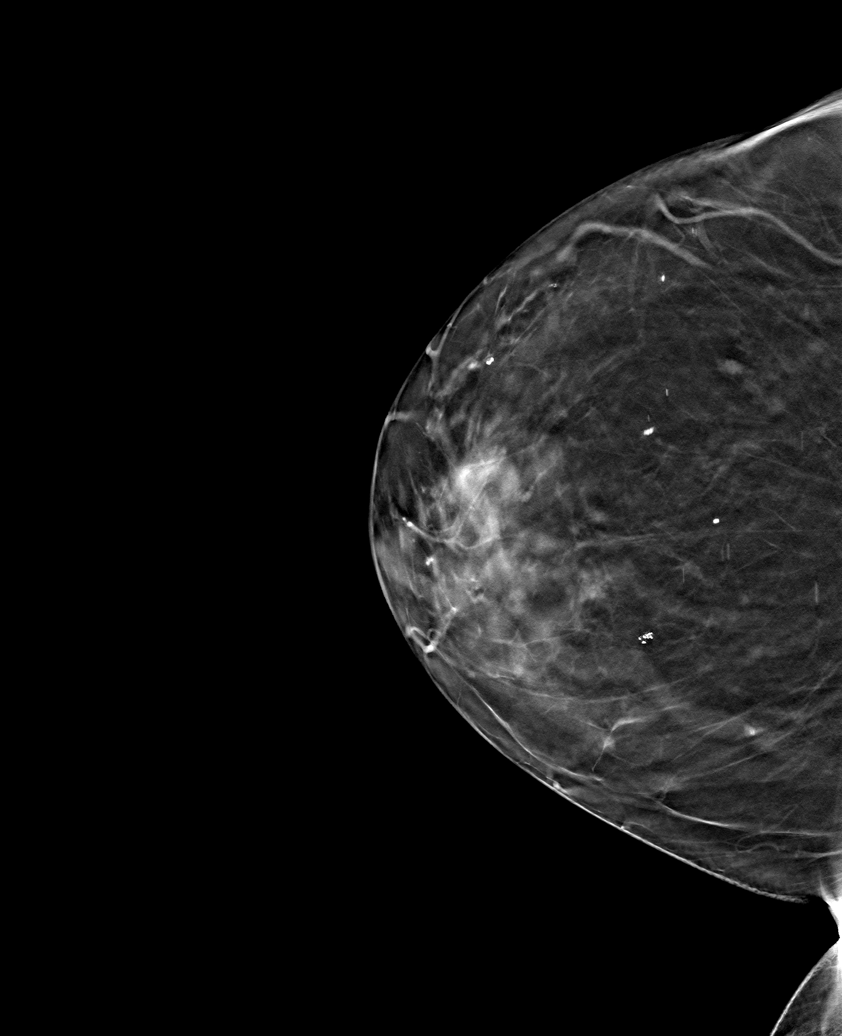

[6 of 30 positions shown; findings below may reference images not displayed]

ACR Breast Density Category b: There are scattered areas of
fibroglandular density.
FINDINGS: There are no findings suspicious for malignancy.
IMPRESSION: No mammographic evidence of malignancy. A result letter of this
screening mammogram will be mailed directly to the patient.

RECOMMENDATION:
Screening mammogram in one year. (Code:Q0-9-1FT)

BI-RADS CATEGORY  1: Negative.

## 2024-01-05 ENCOUNTER — Encounter: Payer: Self-pay | Admitting: Obstetrics and Gynecology

## 2024-01-05 ENCOUNTER — Ambulatory Visit (INDEPENDENT_AMBULATORY_CARE_PROVIDER_SITE_OTHER): Admitting: Obstetrics and Gynecology

## 2024-01-05 VITALS — BP 138/77 | HR 63

## 2024-01-05 DIAGNOSIS — N3281 Overactive bladder: Secondary | ICD-10-CM | POA: Diagnosis not present

## 2024-01-05 DIAGNOSIS — N393 Stress incontinence (female) (male): Secondary | ICD-10-CM | POA: Diagnosis not present

## 2024-01-05 MED ORDER — MIRABEGRON ER 50 MG PO TB24: 50.0000 mg | ORAL_TABLET | Freq: Every day | ORAL | 3 refills | Status: AC

## 2024-01-05 NOTE — Progress Notes (Signed)
 [facility] Return Visit  SUBJECTIVE  History of Present Illness: Crystal Malone is a 83 y.o. female seen in follow-up for mixed incontinence. Underwent urethral bulking in March 2024. Has been taking Myrbetriq  50mg  for OAB symptoms. Currently using a #4 incontinence ring pessary, managed by her OBGYN.   She did not notice any further improvement in her incontinence symptoms when increasing Myrbetriq  to 50mg . After she has the urethral bulking, she was 100% dry for about a year.   Urodynamic Impression 06/13/20:  1. Uroflowmetry: interrupted flow pattern. Voided with PVR 15mL.  2. Cystometrogram:  Sensation increased; capacity normal at 405 mL.  Stress incontinence was demonstrated at ISD-range pressures. Stress testing performed with barrier.  Lowest positive CLPP was 59 cmH20 at .  Lowest positive VLPP was 49 cmH20 at .  Urethral pressure profile: MUCP 52cmH2O.  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  Of note, the patient was taking medication for OAB when this study was performed.  Normal compliance. 3. Micturition: Interrupted pattern with a minimal detrusor contraction and with abdominal straining near end of urination. Voided 370 mL with PVR 35 mL. Qmax 33 ml/sec, Pdet@Qmax  11 cmH20, with concern for obstructive voiding. 4. Electromyography: pelvic floor did relax during voiding.   Past Medical History: Patient  has a past medical history of Arthritis, Back pain, Lung cancer (HCC), and Rheumatoid arthritis (HCC).   Past Surgical History: She  has a past surgical history that includes Breast biopsy (Left); Lung removal, partial (Right); Replacement total knee bilateral; and Spinal fusion.   Medications: She has a current medication list which includes the following prescription(s): albuterol, arnuity ellipta, vitamin d3, fluoxetine, folic acid, methotrexate, mirabegron  er, nystatin  cream, pantoprazole, plaquenil, pyridoxine, vitamin b-12, and zolpidem, and the following Facility-Administered Medications: betamethasone  acetate-betamethasone  sodium phosphate and betamethasone  acetate-betamethasone  sodium phosphate.   Allergies: Patient is allergic to latex and nsaids.   Social History: Patient  reports that she quit smoking about 29 years ago. Her smoking use included cigarettes. She started smoking about 63 years ago. She has a 34 pack-year smoking history. She has never used smokeless tobacco. She reports current alcohol use of about 7.0 standard drinks of alcohol per week. She reports that she does not use drugs.      OBJECTIVE     Physical Exam: Vitals:   01/05/24 1442  BP: 138/77  Pulse: 63    Gen: No apparent distress, A&O x 3.  Detailed Urogynecologic Evaluation:  Deferred.   Results for orders placed or performed in visit on 11/04/23  POCT URINALYSIS DIP (CLINITEK)   Collection Time: 11/04/23  3:54 PM  Result Value Ref Range   Color, UA yellow yellow   Clarity, UA clear clear   Glucose, UA negative negative mg/dL   Bilirubin, UA negative negative   Ketones, POC UA negative negative mg/dL   Spec Grav, UA 8.984 8.989 - 1.025   Blood, UA negative negative   pH, UA 5.5 5.0 - 8.0   POC PROTEIN,UA negative negative, trace   Urobilinogen, UA 0.2 0.2 or 1.0 E.U./dL   Nitrite, UA Negative Negative   Leukocytes, UA Trace (A) Negative     ASSESSMENT AND PLAN    Ms. Torrico is a 83 y.o. with:  1. SUI (stress urinary incontinence, female)   2. Overactive bladder     - She prefers to try the urethral bulking once more to see if this helps her symptoms since  she had such good success the first time. But improvement only lasted for a year.  - We discussed that urethral bulking is only for SUI symptoms so if she still has uncontrolled urgency after urethral bulking, then we will need to consider third line OAB options. If she has more SUI, then will  consider a sling.  - For bulkamid, we discussed success rate of approximately 70-80% and possible need for second injection. We reviewed that this is not a permanent procedure and the Bulkamid does become less effective over time. Risks reviewed including injury to bladder or urethra, UTI, urinary retention and hematuria.  - Refill provided for Myrbetriq  50mg .   Rosaline LOISE Caper, MD

## 2024-05-05 ENCOUNTER — Ambulatory Visit

## 2024-05-05 ENCOUNTER — Other Ambulatory Visit (HOSPITAL_COMMUNITY)
Admission: RE | Admit: 2024-05-05 | Discharge: 2024-05-05 | Disposition: A | Discharge status: Home / Self Care | Source: Ambulatory Visit | Attending: Obstetrics and Gynecology | Admitting: Obstetrics and Gynecology

## 2024-05-05 ENCOUNTER — Ambulatory Visit: Admitting: Obstetrics and Gynecology

## 2024-05-05 VITALS — BP 121/73 | HR 86

## 2024-05-05 DIAGNOSIS — R350 Frequency of micturition: Secondary | ICD-10-CM | POA: Diagnosis present

## 2024-05-05 DIAGNOSIS — N393 Stress incontinence (female) (male): Secondary | ICD-10-CM

## 2024-05-05 LAB — POCT URINALYSIS DIP (CLINITEK)
Bilirubin, UA: NEGATIVE
Blood, UA: NEGATIVE
Glucose, UA: NEGATIVE mg/dL
Ketones, POC UA: NEGATIVE mg/dL
Leukocytes, UA: NEGATIVE
Nitrite, UA: POSITIVE — AB
POC PROTEIN,UA: NEGATIVE
Spec Grav, UA: 1.025
Urobilinogen, UA: 0.2 U/dL
pH, UA: 5.5

## 2024-05-05 MED ORDER — LIDOCAINE HCL URETHRAL/MUCOSAL 2 % EX GEL: 1.0000 | Freq: Once | CUTANEOUS | Status: DC

## 2024-05-05 MED ORDER — LIDOCAINE HCL 2 % IJ SOLN: 6.0000 mL | Freq: Once | INTRAMUSCULAR | Status: DC

## 2024-05-05 MED ORDER — CIPROFLOXACIN HCL 500 MG PO TABS: 500.0000 mg | ORAL_TABLET | Freq: Once | ORAL | Status: DC

## 2024-05-05 NOTE — Progress Notes (Signed)
 Crystal Malone is a 84 y.o. female  arrived today with UTI sx.  Per Dr. Susen protocol: A urine specimen was collected and POCT Urine was done and urine culture sent to the lab. POCT Urine was POSITIVE for Nitrites.

## 2024-05-05 NOTE — Progress Notes (Signed)
 Pt procedure cancelled due to equipment issue. Urine sent for culture due to presence of nitrites. Pt denies any symptoms.

## 2024-05-05 NOTE — Patient Instructions (Addendum)
 Your Urine dip that was done in office was Positive. I am sending the urine off for culture.  We will contact you when the results are back between 3-5 days.  If an antibiotic is needed we will sent the order to the pharmacy and you will be notified. If you have any questions or concerns please feel free to call us  at [phone]

## 2024-05-06 ENCOUNTER — Ambulatory Visit: Payer: Self-pay | Admitting: Obstetrics and Gynecology

## 2024-05-06 DIAGNOSIS — N390 Urinary tract infection, site not specified: Secondary | ICD-10-CM

## 2024-05-06 MED ORDER — SULFAMETHOXAZOLE-TRIMETHOPRIM 800-160 MG PO TABS: 1.0000 | ORAL_TABLET | Freq: Two times a day (BID) | ORAL | 0 refills | Status: AC

## 2024-05-06 NOTE — Progress Notes (Signed)
 Attempted to contact patient. [id number]

## 2024-05-07 LAB — URINE CULTURE: Culture: >=100000 — AB

## 2024-05-09 MED ORDER — NITROFURANTOIN MONOHYD MACRO 100 MG PO CAPS: 100.0000 mg | ORAL_CAPSULE | Freq: Two times a day (BID) | ORAL | 0 refills | Status: AC

## 2024-05-19 ENCOUNTER — Ambulatory Visit: Admitting: Obstetrics and Gynecology

## 2024-05-19 ENCOUNTER — Encounter: Payer: Self-pay | Admitting: Obstetrics and Gynecology

## 2024-05-19 VITALS — BP 133/63 | HR 70

## 2024-05-19 DIAGNOSIS — N393 Stress incontinence (female) (male): Secondary | ICD-10-CM

## 2024-05-19 DIAGNOSIS — R350 Frequency of micturition: Secondary | ICD-10-CM

## 2024-05-19 LAB — POCT URINALYSIS DIP (CLINITEK)
Bilirubin, UA: NEGATIVE
Blood, UA: NEGATIVE
Glucose, UA: NEGATIVE mg/dL
Ketones, POC UA: NEGATIVE mg/dL
Leukocytes, UA: NEGATIVE
Nitrite, UA: NEGATIVE
POC PROTEIN,UA: NEGATIVE
Spec Grav, UA: >=1.03 — AB
Urobilinogen, UA: 0.2 U/dL
pH, UA: 6

## 2024-05-19 MED ORDER — LIDOCAINE HCL 2 % IJ SOLN
6.0000 mL | Freq: Once | INTRAMUSCULAR | Status: AC
  Administered 2024-05-19: 120 mg

## 2024-05-19 MED ORDER — LIDOCAINE HCL URETHRAL/MUCOSAL 2 % EX GEL
1.0000 | Freq: Once | CUTANEOUS | Status: AC
  Administered 2024-05-19: 1 via URETHRAL

## 2024-05-19 MED ORDER — CIPROFLOXACIN HCL 500 MG PO TABS
500.0000 mg | ORAL_TABLET | Freq: Once | ORAL | Status: AC
  Administered 2024-05-19: 500 mg via ORAL

## 2024-05-19 MED ORDER — LIDOCAINE HCL URETHRAL/MUCOSAL 2 % EX GEL: 1.0000 | Freq: Once | CUTANEOUS | Status: DC

## 2024-05-19 NOTE — Progress Notes (Signed)
 Bulkamid Injection  CC: 84 y.o. y.o. F with stress incontinence who presents for transurethral Bulkamid injection.  Patient signed her consent form.  She started antibiotic prophylaxis today.  Today's Vitals   05/19/24 1211  BP: 133/63  Pulse: 70    Results for orders placed or performed in visit on 05/19/24 (from the past 24 hours)  POCT URINALYSIS DIP (CLINITEK)     Status: Abnormal   Collection Time: 05/19/24 12:07 PM  Result Value Ref Range   Color, UA yellow yellow   Clarity, UA clear clear   Glucose, UA negative negative mg/dL   Bilirubin, UA negative negative   Ketones, POC UA negative negative mg/dL   Spec Grav, UA >=8.969 (A) 1.010 - 1.025   Blood, UA negative negative   pH, UA 6.0 5.0 - 8.0   POC PROTEIN,UA negative negative, trace   Urobilinogen, UA 0.2 0.2 or 1.0 E.U./dL   Nitrite, UA Negative Negative   Leukocytes, UA Negative Negative    Procedure: Time out was performed. The bladder was catheterized and 10 ml of 2% lidocaine  jelly placed in the urethra. A urethral block was performed by injecting 3ml of 1% lidocaine  with epinephrine  at 3 and 9 o'clock adjacent to the urethra.  The needle was primed.  The cystoscope was inserted to the level of the bladder neck.  The needle was inserted 2 cm and the scope was pulled back into the urethra 2 cm.  The needle was inserted bevel up at the 5 o'clock position and the Bulkamid was injected to obtain coaptation.  This was repeated at the 2 o'clock,  10 o'clock and 7 o'clock positions.   A total of 1.73ml syringes were used and good circumferential coaptation was noted.  The patient tolerated the procedure well. She was asked to void after the procedure.  Post-Void Residual (PVR) by Bladder Scan: In order to evaluate bladder emptying, we discussed obtaining a postvoid residual and she agreed to this procedure.  Procedure: The ultrasound unit was placed on the patient's abdomen in the suprapubic region after the patient had  voided. A PVR of 43 ml was obtained by bladder scan.     ASSESSMENT: 84 y.o. y.o. s/p transurethral Bulkamid injection for stress incontinence.   PLAN: Patient will follow up in 4 weeks to reassess. Voiding and post-procedure precautions were given. She will return for heavy bleeding, fevers, dysuria lasting beyond today and incomplete emptying.  All questions were answered.  Rosaline LOISE Caper, MD

## 2024-05-19 NOTE — Patient Instructions (Signed)

## 2024-06-01 ENCOUNTER — Ambulatory Visit: Admitting: Obstetrics and Gynecology
